# Patient Record
Sex: Female | Born: 1995 | Race: Black or African American | Hispanic: No | Marital: Married | Smoking: Never smoker
Health system: Southern US, Community
[De-identification: ages and names within clinical notes are randomized; demographics above are authoritative.]

## PROBLEM LIST (undated history)

## (undated) DIAGNOSIS — G43909 Migraine, unspecified, not intractable, without status migrainosus: Secondary | ICD-10-CM

## (undated) DIAGNOSIS — J309 Allergic rhinitis, unspecified: Secondary | ICD-10-CM

## (undated) HISTORY — PX: CYSTECTOMY: SUR359

## (undated) HISTORY — PX: CYST EXCISION: SHX5701

## (undated) HISTORY — DX: Allergic rhinitis, unspecified: J30.9

## (undated) HISTORY — DX: Migraine, unspecified, not intractable, without status migrainosus: G43.909

---

## 2018-10-11 HISTORY — PX: OVARIAN CYST REMOVAL: SHX89

## 2020-01-18 ENCOUNTER — Emergency Department (HOSPITAL_COMMUNITY)
Admission: EM | Admit: 2020-01-18 | Discharge: 2020-01-18 | Disposition: A | Discharge status: Home / Self Care | Payer: Self-pay | Attending: Emergency Medicine | Admitting: Emergency Medicine

## 2020-01-18 ENCOUNTER — Encounter (HOSPITAL_COMMUNITY): Payer: Self-pay | Admitting: *Deleted

## 2020-01-18 ENCOUNTER — Other Ambulatory Visit: Payer: Self-pay

## 2020-01-18 DIAGNOSIS — J302 Other seasonal allergic rhinitis: Secondary | ICD-10-CM | POA: Insufficient documentation

## 2020-01-18 MED ORDER — CETIRIZINE HCL 10 MG PO TABS: 10.0000 mg | ORAL_TABLET | Freq: Every day | ORAL | 0 refills | Status: DC

## 2020-01-18 MED ORDER — PREDNISONE 10 MG PO TABS: 10.0000 mg | ORAL_TABLET | Freq: Every day | ORAL | 0 refills | Status: AC

## 2020-01-18 MED ORDER — FLUTICASONE PROPIONATE 50 MCG/ACT NA SUSP: 2.0000 | Freq: Every day | NASAL | 0 refills | Status: DC

## 2020-01-18 NOTE — ED Triage Notes (Signed)
Pt states she started having burning in left nare and left head yesterday, has been taking allergy meds without relief

## 2020-01-18 NOTE — Discharge Instructions (Signed)
You should trial switching the Claritin to Zyrtec and using Flonase for 1 week.  If your symptoms have not improved significantly then you may take the course of prednisone.  Please follow up with your primary care provider within 5-7 days for re-evaluation of your symptoms. If you do not have a primary care provider, information for a healthcare clinic has been provided for you to make arrangements for follow up care. Please return to the emergency department for any new or worsening symptoms.

## 2020-01-18 NOTE — ED Provider Notes (Signed)
[facility] DEPT Provider Note   CSN: [id number] Arrival date & time: 01/18/20  1112     History Chief Complaint  Patient presents with  . Nasal Congestion    Valerie Meyers is a 24 y.o. female.  HPI   24 year old female presenting for evaluation of nasal congestion, sinus pressure and burning to the left side of the nose started a few days ago.  States this is consistent with her prior history of allergies.  She also reports dry eyes and a scratchy throat.  Denies any rhinorrhea, sore throat, fevers, cough or other associated symptoms.  She is been taking Allegra and Claritin without significant relief of symptoms.  States that she usually requires steroids when she has allergies at the beginning of the year.  History reviewed. No pertinent past medical history.  There are no problems to display for this patient.   Past Surgical History:  Procedure Laterality Date  . CYST EXCISION       OB History   No obstetric history on file.     No family history on file.  Social History   Tobacco Use  . Smoking status: Never Smoker  . Smokeless tobacco: Never Used  Substance Use Topics  . Alcohol use: Yes  . Drug use: Yes    Types: Marijuana    Home Medications Prior to Admission medications   Medication Sig Start Date End Date Taking? Authorizing Provider  cetirizine (ZYRTEC) 10 MG tablet Take 1 tablet (10 mg total) by mouth daily. 01/18/20   Kashina Mecum S, PA-C  fluticasone (FLONASE) 50 MCG/ACT nasal spray Place 2 sprays into both nostrils daily. 01/18/20   Newel Oien S, PA-C  predniSONE (DELTASONE) 10 MG tablet Take 1 tablet (10 mg total) by mouth daily for 5 days. 01/18/20 01/23/20  Chele Cornell S, PA-C    Allergies    Patient has no known allergies.  Review of Systems   Review of Systems  Constitutional: Negative for fever.  HENT: Positive for congestion and sinus pressure. Negative for rhinorrhea and sore throat.   Respiratory:  Negative for cough and shortness of breath.     Physical Exam Updated Vital Signs BP 130/88 (BP Location: Right Arm)   Pulse 85   Temp 98.6 F (37 C) (Oral)   Resp 16   Ht 6\' 1"  (1.854 m)   Wt 109.3 kg   SpO2 100%   BMI 31.80 kg/m   Physical Exam Vitals and nursing note reviewed.  Constitutional:      General: She is not in acute distress.    Appearance: She is well-developed.  HENT:     Head: Normocephalic and atraumatic.     Right Ear: Tympanic membrane normal.     Left Ear: Tympanic membrane normal.     Nose:     Comments: Bilateral nasal turbinates are boggy and enlarged.    Mouth/Throat:     Mouth: Mucous membranes are moist.     Pharynx: No oropharyngeal exudate or posterior oropharyngeal erythema.  Eyes:     Conjunctiva/sclera: Conjunctivae normal.  Cardiovascular:     Rate and Rhythm: Normal rate and regular rhythm.     Heart sounds: No murmur.  Pulmonary:     Effort: Pulmonary effort is normal. No respiratory distress.     Breath sounds: Normal breath sounds.  Abdominal:     Palpations: Abdomen is soft.     Tenderness: There is no abdominal tenderness.  Musculoskeletal:     Cervical back:  Neck supple.  Skin:    General: Skin is warm and dry.  Neurological:     Mental Status: She is alert.     ED Results / Procedures / Treatments   Labs (all labs ordered are listed, but only abnormal results are displayed) Labs Reviewed - No data to display  EKG None  Radiology No results found.  Procedures Procedures (including critical care time)  Medications Ordered in ED Medications - No data to display  ED Course  I have reviewed the triage vital signs and the nursing notes.  Pertinent labs & imaging results that were available during my care of the patient were reviewed by me and considered in my medical decision making (see chart for details).    MDM Rules/Calculators/A&P                       Patient presenting with symptoms consistent with  seasonal allergies.  Vital signs are stable and patient is afebrile.  Nontoxic and nonseptic appearing.  Physical exam was within normal limits.  HEENT exam is grossly normal without evidence of bacterial infection.  Will give symptomatic treatment with allergy medications.  Advised on the risks of repeated steroid use and advised to try Zyrtec and Flonase for 1 week prior to filling Rx for steroids.  Advise follow-up with PCP in 1 week and discussed reasons to return immediately to the ED.  All questions answered and patient understands the plan and reasons to return.   Final Clinical Impression(s) / ED Diagnoses Final diagnoses:  Seasonal allergies    Rx / DC Orders ED Discharge Orders         Ordered    fluticasone (FLONASE) 50 MCG/ACT nasal spray  Daily     01/18/20 1215    cetirizine (ZYRTEC) 10 MG tablet  Daily     01/18/20 1215    predniSONE (DELTASONE) 10 MG tablet  Daily     01/18/20 [address], Dondi Burandt S, PA-C 01/18/20 1215    Rolan Bucco, MD 01/18/20 [phone]

## 2020-03-24 ENCOUNTER — Other Ambulatory Visit: Payer: Self-pay

## 2020-03-24 ENCOUNTER — Encounter (HOSPITAL_COMMUNITY): Payer: Self-pay

## 2020-03-24 ENCOUNTER — Emergency Department (HOSPITAL_COMMUNITY)
Admission: EM | Admit: 2020-03-24 | Discharge: 2020-03-24 | Disposition: A | Discharge status: Home / Self Care | Payer: Self-pay | Attending: Emergency Medicine | Admitting: Emergency Medicine

## 2020-03-24 DIAGNOSIS — G44209 Tension-type headache, unspecified, not intractable: Secondary | ICD-10-CM | POA: Insufficient documentation

## 2020-03-24 MED ORDER — DIPHENHYDRAMINE HCL 50 MG/ML IJ SOLN
25.0000 mg | Freq: Once | INTRAMUSCULAR | Status: AC
  Administered 2020-03-24: 25 mg via INTRAVENOUS
  Filled 2020-03-24: qty 1

## 2020-03-24 MED ORDER — KETOROLAC TROMETHAMINE 30 MG/ML IJ SOLN
30.0000 mg | Freq: Once | INTRAMUSCULAR | Status: AC
  Administered 2020-03-24: 30 mg via INTRAVENOUS
  Filled 2020-03-24: qty 1

## 2020-03-24 MED ORDER — SODIUM CHLORIDE 0.9 % IV BOLUS
1000.0000 mL | Freq: Once | INTRAVENOUS | Status: AC
  Administered 2020-03-24: 1000 mL via INTRAVENOUS

## 2020-03-24 MED ORDER — METOCLOPRAMIDE HCL 5 MG/ML IJ SOLN
10.0000 mg | Freq: Once | INTRAMUSCULAR | Status: AC
  Administered 2020-03-24: 10 mg via INTRAVENOUS
  Filled 2020-03-24: qty 2

## 2020-03-24 NOTE — ED Provider Notes (Signed)
[facility] DEPT Provider Note   CSN: [id number] Arrival date & time: 03/24/20  0263     History Chief Complaint  Patient presents with   Headache    Valerie Meyers is a 24 y.o. female with no significant past medical history presenting to the ED with a chief complaint of headache. States that for the past 3 days she has had a constant left-sided headache with associated nausea.  She has a history of headaches that usually located throughout the entire front part of his head with the symptoms localized to the left side.  She denies any vomiting, does endorse intermittent blurry vision on the left side.  She has been using NSAIDs with only minimal improvement in her headache.  Denies any head injuries or falls, numbness in arms or legs, neck stiffness or fever, personal family history of aneurysms, shortness of breath or possibility of pregnancy. HPI     History reviewed. No pertinent past medical history.  There are no problems to display for this patient.   Past Surgical History:  Procedure Laterality Date   CYST EXCISION     CYSTECTOMY       OB History   No obstetric history on file.     Family History  Family history unknown: Yes    Social History   Tobacco Use   Smoking status: Never Smoker   Smokeless tobacco: Never Used  Scientific laboratory technician Use: Never used  Substance Use Topics   Alcohol use: Yes   Drug use: Yes    Types: Marijuana    Home Medications Prior to Admission medications   Medication Sig Start Date End Date Taking? Authorizing Provider  cetirizine (ZYRTEC) 10 MG tablet Take 1 tablet (10 mg total) by mouth daily. 01/18/20   Couture, Cortni S, PA-C  fluticasone (FLONASE) 50 MCG/ACT nasal spray Place 2 sprays into both nostrils daily. 01/18/20   Couture, Cortni S, PA-C    Allergies    Patient has no known allergies.  Review of Systems   Review of Systems  Constitutional: Negative for appetite change, chills  and fever.  HENT: Negative for ear pain, rhinorrhea, sneezing and sore throat.   Eyes: Positive for visual disturbance. Negative for photophobia.  Respiratory: Negative for cough, chest tightness, shortness of breath and wheezing.   Cardiovascular: Negative for chest pain and palpitations.  Gastrointestinal: Positive for nausea. Negative for abdominal pain, blood in stool, constipation, diarrhea and vomiting.  Genitourinary: Negative for dysuria, hematuria and urgency.  Musculoskeletal: Negative for myalgias.  Skin: Negative for rash.  Neurological: Positive for headaches. Negative for dizziness, weakness and light-headedness.    Physical Exam Updated Vital Signs BP 121/70 (BP Location: Left Arm)    Pulse 88    Temp 98 F (36.7 C) (Oral)    Resp 19    Ht 6\' 1"  (1.854 m)    Wt 104.3 kg    LMP 03/12/2020 (Approximate)    SpO2 99%    BMI 30.34 kg/m   Physical Exam Vitals and nursing note reviewed.  Constitutional:      General: She is not in acute distress.    Appearance: She is well-developed.  HENT:     Head: Normocephalic and atraumatic.     Nose: Nose normal.  Eyes:     General: No scleral icterus.       Right eye: No discharge.        Left eye: No discharge.     Conjunctiva/sclera: Conjunctivae  normal.     Pupils: Pupils are equal, round, and reactive to light.  Cardiovascular:     Rate and Rhythm: Normal rate and regular rhythm.     Heart sounds: Normal heart sounds. No murmur heard.  No friction rub. No gallop.   Pulmonary:     Effort: Pulmonary effort is normal. No respiratory distress.     Breath sounds: Normal breath sounds.  Abdominal:     General: Bowel sounds are normal. There is no distension.     Palpations: Abdomen is soft.     Tenderness: There is no abdominal tenderness. There is no guarding.  Musculoskeletal:        General: Normal range of motion.     Cervical back: Normal range of motion and neck supple.  Skin:    General: Skin is warm and dry.      Findings: No rash.  Neurological:     General: No focal deficit present.     Mental Status: She is alert and oriented to person, place, and time.     Cranial Nerves: No cranial nerve deficit.     Sensory: No sensory deficit.     Motor: No weakness or abnormal muscle tone.     Coordination: Coordination normal.     Comments: Pupils reactive. No facial asymmetry noted. Cranial nerves appear grossly intact. Sensation intact to light touch on face, BUE and BLE. Strength 5/5 in BUE and BLE. Normal finger to nose coordination bilaterally.     ED Results / Procedures / Treatments   Labs (all labs ordered are listed, but only abnormal results are displayed) Labs Reviewed - No data to display  EKG None  Radiology No results found.  Procedures Procedures (including critical care time)  Medications Ordered in ED Medications  sodium chloride 0.9 % bolus 1,000 mL (0 mLs Intravenous Stopped 03/24/20 1122)  metoCLOPramide (REGLAN) injection 10 mg (10 mg Intravenous Given 03/24/20 0946)  diphenhydrAMINE (BENADRYL) injection 25 mg (25 mg Intravenous Given 03/24/20 0946)  ketorolac (TORADOL) 30 MG/ML injection 30 mg (30 mg Intravenous Given 03/24/20 1125)    ED Course  I have reviewed the triage vital signs and the nursing notes.  Pertinent labs & imaging results that were available during my care of the patient were reviewed by me and considered in my medical decision making (see chart for details).    MDM Rules/Calculators/A&P                          .  24 year old female presenting to the ED with a chief complaint of headache.  Reports 3-day history of left-sided headache with associated blurry vision and nausea.  No improvement noted with home medications.  She does have history of headaches which usually are located throughout her head.  No neurological deficits noted on exam.  No facial asymmetry, numbness or weakness noted.  She has a normal gait, normal coordination.  No aphasia noted.   She denies personal or family history of aneurysms.  Patient given migraine cocktail with complete resolution of symptoms.  Suspect that her symptoms are due to a tension headache vs complicated migraine. There are no headache characteristics that are lateralizing or concerning for increased ICP, infectious or vascular cause of her symptoms.  Discussed potential imaging but patient declines and states that "I just want this headache to go away."  Patient will be discharged home with strict return precautions and continued medications.  Patient is hemodynamically stable,  in NAD, and able to ambulate in the ED. Evaluation does not show pathology that would require ongoing emergent intervention or inpatient treatment. I explained the diagnosis to the patient. Pain has been managed and has no complaints prior to discharge. Patient is comfortable with above plan and is stable for discharge at this time. All questions were answered prior to disposition. Strict return precautions for returning to the ED were discussed. Encouraged follow up with PCP.   An After Visit Summary was printed and given to the patient.   Portions of this note were generated with Scientist, clinical (histocompatibility and immunogenetics). Dictation errors may occur despite best attempts at proofreading.  Final Clinical Impression(s) / ED Diagnoses Final diagnoses:  Acute non intractable tension-type headache    Rx / DC Orders ED Discharge Orders    None       Dietrich Pates, PA-C 03/24/20 1210    Tegeler, Canary Brim, MD 03/24/20 1510

## 2020-03-24 NOTE — Discharge Instructions (Addendum)
Continue your home medications. Follow-up with your primary care provider or the 1 listed below. Return to the ER for worsening headache, blurry vision, numbness in arms or legs, trouble walking.

## 2020-03-24 NOTE — ED Triage Notes (Signed)
Patient c/o headache x 3 days.Patient also c/o blurred vision, and nausea.

## 2020-03-25 ENCOUNTER — Encounter (HOSPITAL_COMMUNITY): Payer: Self-pay

## 2020-03-25 ENCOUNTER — Emergency Department (HOSPITAL_COMMUNITY): Payer: Self-pay

## 2020-03-25 ENCOUNTER — Emergency Department (HOSPITAL_COMMUNITY)
Admission: EM | Admit: 2020-03-25 | Discharge: 2020-03-25 | Disposition: A | Discharge status: Home / Self Care | Payer: Self-pay | Attending: Emergency Medicine | Admitting: Emergency Medicine

## 2020-03-25 DIAGNOSIS — F1290 Cannabis use, unspecified, uncomplicated: Secondary | ICD-10-CM | POA: Insufficient documentation

## 2020-03-25 DIAGNOSIS — Z888 Allergy status to other drugs, medicaments and biological substances status: Secondary | ICD-10-CM | POA: Insufficient documentation

## 2020-03-25 DIAGNOSIS — R519 Headache, unspecified: Secondary | ICD-10-CM | POA: Insufficient documentation

## 2020-03-25 DIAGNOSIS — H53149 Visual discomfort, unspecified: Secondary | ICD-10-CM | POA: Insufficient documentation

## 2020-03-25 LAB — CBC WITH DIFFERENTIAL/PLATELET
Abs Immature Granulocytes: 0.03 10*3/uL (ref 0.00–0.07)
Basophils Absolute: 0 10*3/uL (ref 0.0–0.1)
Basophils Relative: 0 %
Eosinophils Absolute: 0 10*3/uL (ref 0.0–0.5)
Eosinophils Relative: 0 %
HCT: 39.6 % (ref 36.0–46.0)
Hemoglobin: 12 g/dL (ref 12.0–15.0)
Immature Granulocytes: 0 %
Lymphocytes Relative: 20 %
Lymphs Abs: 2.2 10*3/uL (ref 0.7–4.0)
MCH: 25.5 pg — ABNORMAL LOW (ref 26.0–34.0)
MCHC: 30.3 g/dL (ref 30.0–36.0)
MCV: 84.1 fL (ref 80.0–100.0)
Monocytes Absolute: 0.6 10*3/uL (ref 0.1–1.0)
Monocytes Relative: 5 %
Neutro Abs: 8.2 10*3/uL — ABNORMAL HIGH (ref 1.7–7.7)
Neutrophils Relative %: 75 %
Platelets: 329 10*3/uL (ref 150–400)
RBC: 4.71 MIL/uL (ref 3.87–5.11)
RDW: 15.9 % — ABNORMAL HIGH (ref 11.5–15.5)
WBC: 11 10*3/uL — ABNORMAL HIGH (ref 4.0–10.5)
nRBC: 0 % (ref 0.0–0.2)

## 2020-03-25 LAB — BASIC METABOLIC PANEL
Anion gap: 9 (ref 5–15)
BUN: 6 mg/dL (ref 6–20)
CO2: 24 mmol/L (ref 22–32)
Calcium: 9 mg/dL (ref 8.9–10.3)
Chloride: 105 mmol/L (ref 98–111)
Creatinine, Ser: 0.81 mg/dL (ref 0.44–1.00)
GFR calc Af Amer: >60 mL/min (ref >60)
GFR calc non Af Amer: >60 mL/min (ref >60)
Glucose, Bld: 101 mg/dL — ABNORMAL HIGH (ref 70–99)
Potassium: 4.4 mmol/L (ref 3.5–5.1)
Sodium: 138 mmol/L (ref 135–145)

## 2020-03-25 LAB — I-STAT BETA HCG BLOOD, ED (MC, WL, AP ONLY): I-stat hCG, quantitative: <5 m[IU]/mL (ref <5)

## 2020-03-25 MED ORDER — SODIUM CHLORIDE (PF) 0.9 % IJ SOLN
INTRAMUSCULAR | Status: AC
  Filled 2020-03-25: qty 50

## 2020-03-25 MED ORDER — DROPERIDOL 2.5 MG/ML IJ SOLN: 2.5000 mg | Freq: Once | INTRAMUSCULAR | Status: DC

## 2020-03-25 MED ORDER — IOHEXOL 350 MG/ML SOLN
100.0000 mL | Freq: Once | INTRAVENOUS | Status: AC | PRN
  Administered 2020-03-25: 100 mL via INTRAVENOUS

## 2020-03-25 MED ORDER — SODIUM CHLORIDE 0.9 % IV BOLUS
1000.0000 mL | Freq: Once | INTRAVENOUS | Status: AC
  Administered 2020-03-25: 1000 mL via INTRAVENOUS

## 2020-03-25 MED ORDER — PROCHLORPERAZINE EDISYLATE 10 MG/2ML IJ SOLN
5.0000 mg | Freq: Once | INTRAMUSCULAR | Status: AC
  Administered 2020-03-25: 5 mg via INTRAVENOUS
  Filled 2020-03-25: qty 2

## 2020-03-25 MED ORDER — SODIUM CHLORIDE 0.9 % IV SOLN: INTRAVENOUS | Status: DC

## 2020-03-25 MED ORDER — DEXAMETHASONE SODIUM PHOSPHATE 10 MG/ML IJ SOLN
10.0000 mg | Freq: Once | INTRAMUSCULAR | Status: AC
  Administered 2020-03-25: 10 mg via INTRAVENOUS
  Filled 2020-03-25: qty 1

## 2020-03-25 MED ORDER — DROPERIDOL 2.5 MG/ML IJ SOLN
1.2500 mg | Freq: Once | INTRAMUSCULAR | Status: AC
  Administered 2020-03-25: 1.25 mg via INTRAVENOUS
  Filled 2020-03-25: qty 2

## 2020-03-25 MED ORDER — VALPROATE SODIUM 500 MG/5ML IV SOLN
500.0000 mg | Freq: Once | INTRAVENOUS | Status: AC
  Administered 2020-03-25: 500 mg via INTRAVENOUS
  Filled 2020-03-25: qty 5

## 2020-03-25 MED ORDER — DIPHENHYDRAMINE HCL 50 MG/ML IJ SOLN
12.5000 mg | Freq: Once | INTRAMUSCULAR | Status: AC
  Administered 2020-03-25: 12.5 mg via INTRAVENOUS
  Filled 2020-03-25: qty 1

## 2020-03-25 NOTE — ED Triage Notes (Signed)
Patient arrived stating she has a headache that did not resolve after being seen yesterday. Reports photosensitivity but denies NV. Last dose of Ibuprofen at 7pm last night.

## 2020-03-25 NOTE — ED Provider Notes (Signed)
[facility] DEPT Provider Note   CSN: [id number] Arrival date & time: 03/25/20  0530     History Chief Complaint  Patient presents with  . Headache    Valerie Meyers is a 24 y.o. female.  24 year old female presents with headache x24 to 48 hours.  Seen yesterday in the ED for similar complaints.  Work at that time included pain medications as she did feel better.  She at that time did not have any fever or chills.  No focal neurological deficits.  Mild photophobia at that time.  Denies any neck pain or new rashes.  States she went home and then took a nap and when she woke up the headache returned characterized as frontal throbbing.  Photophobia continued with flashing lights.  Has nausea but no vomiting.  Still denies neck pain.  Took ibuprofen without relief        History reviewed. No pertinent past medical history.  There are no problems to display for this patient.   Past Surgical History:  Procedure Laterality Date  . CYST EXCISION    . CYSTECTOMY       OB History   No obstetric history on file.     Family History  Family history unknown: Yes    Social History   Tobacco Use  . Smoking status: Never Smoker  . Smokeless tobacco: Never Used  Vaping Use  . Vaping Use: Never used  Substance Use Topics  . Alcohol use: Yes  . Drug use: Yes    Types: Marijuana    Home Medications Prior to Admission medications   Medication Sig Start Date End Date Taking? Authorizing Provider  ibuprofen (ADVIL) 200 MG tablet Take 200 mg by mouth every 6 (six) hours as needed for headache or moderate pain.   Yes [provider]  cetirizine (ZYRTEC) 10 MG tablet Take 1 tablet (10 mg total) by mouth daily. Patient not taking: Reported on 03/25/2020 01/18/20   Couture, Cortni S, PA-C  fluticasone (FLONASE) 50 MCG/ACT nasal spray Place 2 sprays into both nostrils daily. Patient not taking: Reported on 03/25/2020 01/18/20   Couture, Cortni S, PA-C     Allergies    Other  Review of Systems   Review of Systems  All other systems reviewed and are negative.   Physical Exam Updated Vital Signs BP 130/84   Pulse 74   Temp 98 F (36.7 C) (Oral)   Resp 16   LMP 03/12/2020 (Approximate)   SpO2 100%   Physical Exam Vitals and nursing note reviewed.  Constitutional:      General: She is not in acute distress.    Appearance: Normal appearance. She is well-developed. She is not toxic-appearing.  HENT:     Head: Normocephalic and atraumatic.  Eyes:     General: Lids are normal.     Conjunctiva/sclera: Conjunctivae normal.     Pupils: Pupils are equal, round, and reactive to light.  Neck:     Thyroid: No thyroid mass.     Trachea: No tracheal deviation.  Cardiovascular:     Rate and Rhythm: Normal rate and regular rhythm.     Heart sounds: Normal heart sounds. No murmur heard.  No gallop.   Pulmonary:     Effort: Pulmonary effort is normal. No respiratory distress.     Breath sounds: Normal breath sounds. No stridor. No decreased breath sounds, wheezing, rhonchi or rales.  Abdominal:     General: Bowel sounds are normal. There is no  distension.     Palpations: Abdomen is soft.     Tenderness: There is no abdominal tenderness. There is no rebound.  Musculoskeletal:        General: No tenderness. Normal range of motion.     Cervical back: Normal range of motion and neck supple.  Skin:    General: Skin is warm and dry.     Findings: No abrasion or rash.  Neurological:     General: No focal deficit present.     Mental Status: She is alert and oriented to person, place, and time.     GCS: GCS eye subscore is 4. GCS verbal subscore is 5. GCS motor subscore is 6.     Cranial Nerves: No cranial nerve deficit.     Sensory: No sensory deficit.  Psychiatric:        Speech: Speech normal.        Behavior: Behavior normal.     ED Results / Procedures / Treatments   Labs (all labs ordered are listed, but only abnormal  results are displayed) Labs Reviewed  CBC WITH DIFFERENTIAL/PLATELET  BASIC METABOLIC PANEL  I-STAT BETA HCG BLOOD, ED ([facility], [facility], [facility] ONLY)    EKG None  Radiology No results found.  Procedures Procedures (including critical care time)  Medications Ordered in ED Medications  sodium chloride 0.9 % bolus 1,000 mL (has no administration in time range)  0.9 %  sodium chloride infusion (has no administration in time range)  prochlorperazine (COMPAZINE) injection 5 mg (has no administration in time range)  diphenhydrAMINE (BENADRYL) injection 12.5 mg (has no administration in time range)    ED Course  I have reviewed the triage vital signs and the nursing notes.  Pertinent labs & imaging results that were available during my care of the patient were reviewed by me and considered in my medical decision making (see chart for details).    MDM Rules/Calculators/A&P                         Patient has CT angio which is negative for hemorrhage or aneurysm. Patient treated multiple medications for her migraine and does feel better.  Patient stable for discharge Final Clinical Impression(s) / ED Diagnoses Final diagnoses:  None    Rx / DC Orders ED Discharge Orders    None       Lorre Nick, MD 03/25/20 1305

## 2020-03-25 NOTE — ED Notes (Signed)
An After Visit Summary was printed and given to the patient. Discharge instructions given and no further questions at this time.  

## 2020-06-12 ENCOUNTER — Other Ambulatory Visit: Payer: Self-pay

## 2020-06-12 ENCOUNTER — Encounter (HOSPITAL_COMMUNITY): Payer: Self-pay

## 2020-06-12 ENCOUNTER — Emergency Department (HOSPITAL_COMMUNITY)
Admission: EM | Admit: 2020-06-12 | Discharge: 2020-06-12 | Disposition: A | Discharge status: Home / Self Care | Payer: HRSA Program | Attending: Emergency Medicine | Admitting: Emergency Medicine

## 2020-06-12 DIAGNOSIS — U071 COVID-19: Secondary | ICD-10-CM | POA: Insufficient documentation

## 2020-06-12 DIAGNOSIS — R509 Fever, unspecified: Secondary | ICD-10-CM | POA: Diagnosis present

## 2020-06-12 DIAGNOSIS — Z20822 Contact with and (suspected) exposure to covid-19: Secondary | ICD-10-CM

## 2020-06-12 LAB — SARS CORONAVIRUS 2 BY RT PCR (HOSPITAL ORDER, PERFORMED IN ~~LOC~~ HOSPITAL LAB): SARS Coronavirus 2: POSITIVE — AB

## 2020-06-12 MED ORDER — ACETAMINOPHEN 500 MG PO TABS
1000.0000 mg | ORAL_TABLET | Freq: Once | ORAL | Status: AC
  Administered 2020-06-12: 1000 mg via ORAL
  Filled 2020-06-12: qty 2

## 2020-06-12 MED ORDER — ALBUTEROL SULFATE HFA 108 (90 BASE) MCG/ACT IN AERS: 1.0000 | INHALATION_SPRAY | Freq: Four times a day (QID) | RESPIRATORY_TRACT | 0 refills | Status: DC | PRN

## 2020-06-12 MED ORDER — ONDANSETRON 4 MG PO TBDP: 4.0000 mg | ORAL_TABLET | Freq: Three times a day (TID) | ORAL | 0 refills | Status: DC | PRN

## 2020-06-12 NOTE — ED Provider Notes (Signed)
Emergency Department Provider Note   I have reviewed the triage vital signs and the nursing notes.   HISTORY  Chief Complaint Fever   HPI Valerie Meyers is a 24 y.o. female presents to the ED with fever, chills, nausea, vomiting, and HA starting in the last 7 days. She has COVID positive children at home but has not tested positive yet herself. She denies any CP or SOB. No radiation of symptoms or modifying factors.     History reviewed. No pertinent past medical history.  There are no problems to display for this patient.   Past Surgical History:  Procedure Laterality Date   CYST EXCISION     CYSTECTOMY      Allergies Other  Family History  Family history unknown: Yes    Social History Social History   Tobacco Use   Smoking status: Never Smoker   Smokeless tobacco: Never Used  Building services engineer Use: Never used  Substance Use Topics   Alcohol use: Yes   Drug use: Yes    Types: Marijuana    Review of Systems  Constitutional: Positive fever/chills Eyes: No visual changes. ENT: No sore throat. Cardiovascular: Denies chest pain. Respiratory: Denies shortness of breath. Positive cough.  Gastrointestinal: No abdominal pain. Positive nausea and vomiting.  No diarrhea.  No constipation. Genitourinary: Negative for dysuria. Musculoskeletal: Positive body aches diffusely.  Skin: Negative for rash. Neurological: Negative for focal weakness or numbness. Positive HA.   10-point ROS otherwise negative.  ____________________________________________   PHYSICAL EXAM:  VITAL SIGNS: ED Triage Vitals  Enc Vitals Group     BP 06/12/20 1127 121/72     Pulse Rate 06/12/20 1127 76     Resp 06/12/20 1127 18     Temp 06/12/20 1127 (!) 101.9 F (38.8 C)     Temp Source 06/12/20 1127 Oral     SpO2 06/12/20 1127 100 %   Constitutional: Alert and oriented. Well appearing and in no acute distress. Eyes: Conjunctivae are normal. Head: Atraumatic. Nose: No  congestion/rhinnorhea. Mouth/Throat: Mucous membranes are moist.  Neck: No stridor.   Cardiovascular: Normal rate, regular rhythm. Good peripheral circulation. Grossly normal heart sounds.   Respiratory: Normal respiratory effort.  No retractions. Lungs CTAB. Gastrointestinal: No distention.  Musculoskeletal: No gross deformities of extremities. Neurologic:  Normal speech and language.  Skin:  Skin is warm, dry and intact. No rash noted.   ____________________________________________   LABS (all labs ordered are listed, but only abnormal results are displayed)  Labs Reviewed  SARS CORONAVIRUS 2 BY RT PCR (HOSPITAL ORDER, PERFORMED IN [facility]) - Abnormal; Notable for the following components:      Result Value   SARS Coronavirus 2 POSITIVE (*)    All other components within normal limits   ____________________________________________   PROCEDURES  Procedure(s) performed:   Procedures  None ____________________________________________   INITIAL IMPRESSION / ASSESSMENT AND PLAN / ED COURSE  Pertinent labs & imaging results that were available during my care of the patient were reviewed by me and considered in my medical decision making (see chart for details).   Patient presents with COVID symptoms and close positive contacts. Doubt sepsis or developing serious bacterial infection. Most of symptoms are respiratory. Patient tested here with PCR and with quarantine pending results. No respiratory distress or hypoxemia to prompt emergent testing or other treatments here.   Valerie Meyers was evaluated in Emergency Department on 06/16/2020 for the symptoms described in the history of present  illness. She was evaluated in the context of the global COVID-19 pandemic, which necessitated consideration that the patient might be at risk for infection with the SARS-CoV-2 virus that causes COVID-19. Institutional protocols and algorithms that pertain to the evaluation of  patients at risk for COVID-19 are in a state of rapid change based on information released by regulatory bodies including the CDC and federal and state organizations. These policies and algorithms were followed during the patient's care in the ED.  ____________________________________________  FINAL CLINICAL IMPRESSION(S) / ED DIAGNOSES  Final diagnoses:  Suspected COVID-19 virus infection     MEDICATIONS GIVEN DURING THIS VISIT:  Medications  acetaminophen (TYLENOL) tablet 1,000 mg (1,000 mg Oral Given 06/12/20 1203)     NEW OUTPATIENT MEDICATIONS STARTED DURING THIS VISIT:  Discharge Medication List as of 06/12/2020 12:02 PM    START taking these medications   Details  albuterol (VENTOLIN HFA) 108 (90 Base) MCG/ACT inhaler Inhale 1-2 puffs into the lungs every 6 (six) hours as needed for wheezing or shortness of breath., Starting Thu 06/12/2020, Normal    ondansetron (ZOFRAN ODT) 4 MG disintegrating tablet Take 1 tablet (4 mg total) by mouth every 8 (eight) hours as needed for nausea or vomiting., Starting Thu 06/12/2020, Normal        Note:  This document was prepared using Dragon voice recognition software and may include unintentional dictation errors.  Alona Bene, MD, [facility]    Erez Mccallum, Arlyss Repress, MD 06/16/20 [phone]

## 2020-06-12 NOTE — ED Triage Notes (Addendum)
Pt arrived via walk in, c/o fevers, chills, n/v, headache and back pain since last night. States children at home COVID (+). Has not taken tylenol since last night. Able to tolerate POs at home.

## 2020-06-12 NOTE — Discharge Instructions (Signed)
Person Under Monitoring Name: Valerie Meyers  Location: [address] [place] [address] [place] [address]   Infection Prevention Recommendations for Individuals Confirmed to have, or Being Evaluated for, 2019 Novel Coronavirus (COVID-19) Infection Who Receive Care at Home  Individuals who are confirmed to have, or are being evaluated for, COVID-19 should follow the prevention steps below until a healthcare provider or local or state health department says they can return to normal activities.  Stay home except to get medical care You should restrict activities outside your home, except for getting medical care. Do not go to work, school, or public areas, and do not use public transportation or taxis.  Call ahead before visiting your doctor Before your medical appointment, call the healthcare provider and tell them that you have, or are being evaluated for, COVID-19 infection. This will help the healthcare provider's office take steps to keep other people from getting infected. Ask your healthcare provider to call the local or state health department.  Monitor your symptoms Seek prompt medical attention if your illness is worsening (e.g., difficulty breathing). Before going to your medical appointment, call the healthcare provider and tell them that you have, or are being evaluated for, COVID-19 infection. Ask your healthcare provider to call the local or state health department.  Wear a facemask You should wear a facemask that covers your nose and mouth when you are in the same room with other people and when you visit a healthcare provider. People who live with or visit you should also wear a facemask while they are in the same room with you.  Separate yourself from other people in your home As much as possible, you should stay in a different room from other people in your home. Also, you should use a separate bathroom, if available.  Avoid sharing household items You should not  share dishes, drinking glasses, cups, eating utensils, towels, bedding, or other items with other people in your home. After using these items, you should wash them thoroughly with soap and water.  Cover your coughs and sneezes Cover your mouth and nose with a tissue when you cough or sneeze, or you can cough or sneeze into your sleeve. Throw used tissues in a lined trash can, and immediately wash your hands with soap and water for at least 20 seconds or use an alcohol-based hand rub.  Wash your [employer] your hands often and thoroughly with soap and water for at least 20 seconds. You can use an alcohol-based hand sanitizer if soap and water are not available and if your hands are not visibly dirty. Avoid touching your eyes, nose, and mouth with unwashed hands.   Prevention Steps for Caregivers and Household Members of Individuals Confirmed to have, or Being Evaluated for, COVID-19 Infection Being Cared for in the Home  If you live with, or provide care at home for, a person confirmed to have, or being evaluated for, COVID-19 infection please follow these guidelines to prevent infection:  Follow healthcare provider's instructions Make sure that you understand and can help the patient follow any healthcare provider instructions for all care.  Provide for the patient's basic needs You should help the patient with basic needs in the home and provide support for getting groceries, prescriptions, and other personal needs.  Monitor the patient's symptoms If they are getting sicker, call his or her medical provider and tell them that the patient has, or is being evaluated for, COVID-19 infection. This will help the healthcare provider's  office take steps to keep other people from getting infected. Ask the healthcare provider to call the local or state health department.  Limit the number of people who have contact with the patient If possible, have only one caregiver for the  patient. Other household members should stay in another home or place of residence. If this is not possible, they should stay in another room, or be separated from the patient as much as possible. Use a separate bathroom, if available. Restrict visitors who do not have an essential need to be in the home.  Keep older adults, very young children, and other sick people away from the patient Keep older adults, very young children, and those who have compromised immune systems or chronic health conditions away from the patient. This includes people with chronic heart, lung, or kidney conditions, diabetes, and cancer.  Ensure good ventilation Make sure that shared spaces in the home have good air flow, such as from an air conditioner or an opened window, weather permitting.  Wash your hands often Wash your hands often and thoroughly with soap and water for at least 20 seconds. You can use an alcohol based hand sanitizer if soap and water are not available and if your hands are not visibly dirty. Avoid touching your eyes, nose, and mouth with unwashed hands. Use disposable paper towels to dry your hands. If not available, use dedicated cloth towels and replace them when they become wet.  Wear a facemask and gloves Wear a disposable facemask at all times in the room and gloves when you touch or have contact with the patient's blood, body fluids, and/or secretions or excretions, such as sweat, saliva, sputum, nasal mucus, vomit, urine, or feces.  Ensure the mask fits over your nose and mouth tightly, and do not touch it during use. Throw out disposable facemasks and gloves after using them. Do not reuse. Wash your hands immediately after removing your facemask and gloves. If your personal clothing becomes contaminated, carefully remove clothing and launder. Wash your hands after handling contaminated clothing. Place all used disposable facemasks, gloves, and other waste in a lined container before  disposing them with other household waste. Remove gloves and wash your hands immediately after handling these items.  Do not share dishes, glasses, or other household items with the patient Avoid sharing household items. You should not share dishes, drinking glasses, cups, eating utensils, towels, bedding, or other items with a patient who is confirmed to have, or being evaluated for, COVID-19 infection. After the person uses these items, you should wash them thoroughly with soap and water.  Wash laundry thoroughly Immediately remove and wash clothes or bedding that have blood, body fluids, and/or secretions or excretions, such as sweat, saliva, sputum, nasal mucus, vomit, urine, or feces, on them. Wear gloves when handling laundry from the patient. Read and follow directions on labels of laundry or clothing items and detergent. In general, wash and dry with the warmest temperatures recommended on the label.  Clean all areas the individual has used often Clean all touchable surfaces, such as counters, tabletops, doorknobs, bathroom fixtures, toilets, phones, keyboards, tablets, and bedside tables, every day. Also, clean any surfaces that may have blood, body fluids, and/or secretions or excretions on them. Wear gloves when cleaning surfaces the patient has come in contact with. Use a diluted bleach solution (e.g., dilute bleach with 1 part bleach and 10 parts water) or a household disinfectant with a label that says EPA-registered for coronaviruses. To make a  bleach solution at home, add 1 tablespoon of bleach to 1 quart (4 cups) of water. For a larger supply, add  cup of bleach to 1 gallon (16 cups) of water. Read labels of cleaning products and follow recommendations provided on product labels. Labels contain instructions for safe and effective use of the cleaning product including precautions you should take when applying the product, such as wearing gloves or eye protection and making sure you  have good ventilation during use of the product. Remove gloves and wash hands immediately after cleaning.  Monitor yourself for signs and symptoms of illness Caregivers and household members are considered close contacts, should monitor their health, and will be asked to limit movement outside of the home to the extent possible. Follow the monitoring steps for close contacts listed on the symptom monitoring form.   ? If you have additional questions, contact your local health department or call the epidemiologist on call at [phone] (available 24/7). ? This guidance is subject to change. For the most up-to-date guidance from [facility], please refer to their website: [email or web address]

## 2020-12-19 ENCOUNTER — Emergency Department (HOSPITAL_COMMUNITY)
Admission: EM | Admit: 2020-12-19 | Discharge: 2020-12-19 | Disposition: A | Discharge status: Home / Self Care | Payer: Self-pay | Attending: Emergency Medicine | Admitting: Emergency Medicine

## 2020-12-19 ENCOUNTER — Other Ambulatory Visit: Payer: Self-pay

## 2020-12-19 ENCOUNTER — Encounter (HOSPITAL_COMMUNITY): Payer: Self-pay

## 2020-12-19 DIAGNOSIS — N760 Acute vaginitis: Secondary | ICD-10-CM | POA: Insufficient documentation

## 2020-12-19 DIAGNOSIS — B9689 Other specified bacterial agents as the cause of diseases classified elsewhere: Secondary | ICD-10-CM | POA: Insufficient documentation

## 2020-12-19 DIAGNOSIS — K047 Periapical abscess without sinus: Secondary | ICD-10-CM

## 2020-12-19 DIAGNOSIS — K029 Dental caries, unspecified: Secondary | ICD-10-CM | POA: Insufficient documentation

## 2020-12-19 LAB — URINALYSIS, ROUTINE W REFLEX MICROSCOPIC
Bilirubin Urine: NEGATIVE
Glucose, UA: NEGATIVE mg/dL
Hgb urine dipstick: NEGATIVE
Ketones, ur: NEGATIVE mg/dL
Leukocytes,Ua: NEGATIVE
Nitrite: NEGATIVE
Protein, ur: NEGATIVE mg/dL
Specific Gravity, Urine: 1.02 (ref 1.005–1.030)
pH: 7 (ref 5.0–8.0)

## 2020-12-19 LAB — WET PREP, GENITAL
Sperm: NONE SEEN
Trich, Wet Prep: NONE SEEN
Yeast Wet Prep HPF POC: NONE SEEN

## 2020-12-19 LAB — I-STAT BETA HCG BLOOD, ED (MC, WL, AP ONLY): I-stat hCG, quantitative: <5 m[IU]/mL (ref <5)

## 2020-12-19 LAB — HIV ANTIBODY (ROUTINE TESTING W REFLEX): HIV Screen 4th Generation wRfx: NONREACTIVE

## 2020-12-19 MED ORDER — AMOXICILLIN 500 MG PO CAPS: 500.0000 mg | ORAL_CAPSULE | Freq: Three times a day (TID) | ORAL | 0 refills | Status: DC

## 2020-12-19 MED ORDER — METRONIDAZOLE 500 MG PO TABS: 500.0000 mg | ORAL_TABLET | Freq: Two times a day (BID) | ORAL | 0 refills | Status: DC

## 2020-12-19 MED ORDER — ACETAMINOPHEN 500 MG PO TABS
1000.0000 mg | ORAL_TABLET | Freq: Once | ORAL | Status: AC
  Administered 2020-12-19: 1000 mg via ORAL
  Filled 2020-12-19: qty 2

## 2020-12-19 NOTE — Discharge Instructions (Signed)
Follow-up with a dentist as soon as possible.  Return here as needed if you have any worsening symptoms.

## 2020-12-19 NOTE — ED Triage Notes (Signed)
patient reports that when she woke today she noted that her face was swollen. Patient denies any problems breathing or swallowing.  Patient c/o yellow vaginal discharge x 2 days. Patient states "I used a different Darene Lamer product and then I got the discharge."

## 2020-12-19 NOTE — ED Provider Notes (Signed)
[facility]-EMERGENCY DEPT Provider Note   CSN: [id number] Arrival date & time: 12/19/20  1656     History Chief Complaint  Patient presents with  . Facial Swelling  . Vaginal Discharge    Valerie Meyers is a 25 y.o. female.  Patient is a 25 year old female who presents with vaginal discharge.  She presents with a 2-day history of yellow discharge.  She also says it has a foul odor.  She denies abdominal pain.  No urinary symptoms.  No nausea or vomiting.  No known fevers.  She has had a history of BV in the past.  She says that she switch to a different Darene Lamer product and thinks that may have caused it.  She also woke up this morning with some tooth ache in her left upper tooth and some swelling around the area.  She denies any other facial swelling.  No trouble swallowing.  No sinus congestion or discharge.        History reviewed. No pertinent past medical history.  There are no problems to display for this patient.   Past Surgical History:  Procedure Laterality Date  . CYST EXCISION    . CYSTECTOMY       OB History   No obstetric history on file.     Family History  Family history unknown: Yes    Social History   Tobacco Use  . Smoking status: Never Smoker  . Smokeless tobacco: Never Used  Vaping Use  . Vaping Use: Never used  Substance Use Topics  . Alcohol use: Yes  . Drug use: Yes    Types: Marijuana    Home Medications Prior to Admission medications   Medication Sig Start Date End Date Taking? Authorizing Provider  amoxicillin (AMOXIL) 500 MG capsule Take 1 capsule (500 mg total) by mouth 3 (three) times daily. 12/19/20  Yes Rolan Bucco, MD  metroNIDAZOLE (FLAGYL) 500 MG tablet Take 1 tablet (500 mg total) by mouth 2 (two) times daily. One po bid x 7 days 12/19/20  Yes Rolan Bucco, MD  albuterol (VENTOLIN HFA) 108 (90 Base) MCG/ACT inhaler Inhale 1-2 puffs into the lungs every 6 (six) hours as needed for wheezing or shortness of  breath. 06/12/20   Long, Arlyss Repress, MD  cetirizine (ZYRTEC) 10 MG tablet Take 1 tablet (10 mg total) by mouth daily. Patient not taking: Reported on 03/25/2020 01/18/20   Couture, Cortni S, PA-C  fluticasone (FLONASE) 50 MCG/ACT nasal spray Place 2 sprays into both nostrils daily. Patient not taking: Reported on 03/25/2020 01/18/20   Couture, Cortni S, PA-C  ibuprofen (ADVIL) 200 MG tablet Take 200 mg by mouth every 6 (six) hours as needed for headache or moderate pain.    [provider]  ondansetron (ZOFRAN ODT) 4 MG disintegrating tablet Take 1 tablet (4 mg total) by mouth every 8 (eight) hours as needed for nausea or vomiting. 06/12/20   Long, Arlyss Repress, MD    Allergies    Other  Review of Systems   Review of Systems  Constitutional: Negative for chills, diaphoresis, fatigue and fever.  HENT: Positive for dental problem. Negative for congestion, rhinorrhea and sneezing.   Eyes: Negative.   Respiratory: Negative for cough, chest tightness and shortness of breath.   Cardiovascular: Negative for chest pain and leg swelling.  Gastrointestinal: Negative for abdominal pain, blood in stool, diarrhea, nausea and vomiting.  Genitourinary: Positive for vaginal discharge. Negative for difficulty urinating, flank pain, frequency, hematuria, vaginal bleeding and vaginal  pain.  Musculoskeletal: Negative for arthralgias and back pain.  Skin: Negative for rash.  Neurological: Negative for dizziness, speech difficulty, weakness, numbness and headaches.    Physical Exam Updated Vital Signs BP 110/61 (BP Location: Left Arm)   Pulse 73   Temp 98.4 F (36.9 C) (Oral)   Resp 16   Ht 5' 9.75" (1.772 m)   Wt 97.8 kg   LMP 12/09/2020 (Approximate)   SpO2 99%   BMI 31.16 kg/m   Physical Exam Constitutional:      Appearance: She is well-developed.  HENT:     Head: Normocephalic and atraumatic.     Mouth/Throat:     Comments: Positive tenderness around the left upper first molar.  There is some  mild swelling to the area.  No induration or fluctuance is noted.  No trismus.  Uvula is midline.  No other facial swelling is noted. Eyes:     Pupils: Pupils are equal, round, and reactive to light.  Cardiovascular:     Rate and Rhythm: Normal rate and regular rhythm.     Heart sounds: Normal heart sounds.  Pulmonary:     Effort: Pulmonary effort is normal. No respiratory distress.     Breath sounds: Normal breath sounds. No wheezing or rales.  Chest:     Chest wall: No tenderness.  Abdominal:     General: Bowel sounds are normal.     Palpations: Abdomen is soft.     Tenderness: There is no abdominal tenderness. There is no guarding or rebound.  Genitourinary:    Comments: Positive white discharge, no cervical motion tenderness, no adnexal tenderness Musculoskeletal:        General: Normal range of motion.     Cervical back: Normal range of motion and neck supple.  Lymphadenopathy:     Cervical: No cervical adenopathy.  Skin:    General: Skin is warm and dry.     Findings: No rash.  Neurological:     Mental Status: She is alert and oriented to person, place, and time.     ED Results / Procedures / Treatments   Labs (all labs ordered are listed, but only abnormal results are displayed) Labs Reviewed  WET PREP, GENITAL - Abnormal; Notable for the following components:      Result Value   Clue Cells Wet Prep HPF POC PRESENT (*)    WBC, Wet Prep HPF POC FEW (*)    All other components within normal limits  URINALYSIS, ROUTINE W REFLEX MICROSCOPIC - Abnormal; Notable for the following components:   Color, Urine AMBER (*)    All other components within normal limits  RPR  HIV ANTIBODY (ROUTINE TESTING W REFLEX)  I-STAT BETA HCG BLOOD, ED ([facility], [facility], [facility] ONLY)  GC/CHLAMYDIA PROBE AMP ([place]) NOT AT [facility]    EKG None  Radiology No results found.  Procedures Procedures   Medications Ordered in ED Medications  acetaminophen (TYLENOL) tablet 1,000 mg (1,000 mg Oral  Given 12/19/20 1807)    ED Course  I have reviewed the triage vital signs and the nursing notes.  Pertinent labs & imaging results that were available during my care of the patient were reviewed by me and considered in my medical decision making (see chart for details).    MDM Rules/Calculators/A&P                          Patient is a 25 year old female who presents with vaginal discharge.  She has evidence of BV on exam.  Her STD testing is pending.  She will be started on Flagyl.  She also has some mild swelling around her left upper molar.  I do not see any obvious tooth decay but it appears to be consistent with a dental infection.  We will start her on amoxicillin.  She does not have any airway compromise or suggestions of deeper infections.  She was given resources about following up with a dentist.  Return precautions were given. Final Clinical Impression(s) / ED Diagnoses Final diagnoses:  Dental infection  BV (bacterial vaginosis)    Rx / DC Orders ED Discharge Orders         Ordered    metroNIDAZOLE (FLAGYL) 500 MG tablet  2 times daily        12/19/20 2004    amoxicillin (AMOXIL) 500 MG capsule  3 times daily        12/19/20 2004           Rolan Bucco, MD 12/19/20 2006

## 2020-12-20 LAB — RPR: RPR Ser Ql: NONREACTIVE

## 2020-12-21 LAB — GC/CHLAMYDIA PROBE AMP (~~LOC~~) NOT AT ARMC
Chlamydia: NEGATIVE
Comment: NEGATIVE
Comment: NORMAL
Neisseria Gonorrhea: NEGATIVE

## 2021-05-13 ENCOUNTER — Emergency Department (HOSPITAL_COMMUNITY)
Admission: EM | Admit: 2021-05-13 | Discharge: 2021-05-13 | Disposition: A | Discharge status: Home / Self Care | Payer: Self-pay | Attending: Emergency Medicine | Admitting: Emergency Medicine

## 2021-05-13 ENCOUNTER — Other Ambulatory Visit: Payer: Self-pay

## 2021-05-13 ENCOUNTER — Encounter (HOSPITAL_COMMUNITY): Payer: Self-pay

## 2021-05-13 DIAGNOSIS — R531 Weakness: Secondary | ICD-10-CM | POA: Insufficient documentation

## 2021-05-13 DIAGNOSIS — Z20822 Contact with and (suspected) exposure to covid-19: Secondary | ICD-10-CM | POA: Insufficient documentation

## 2021-05-13 DIAGNOSIS — R202 Paresthesia of skin: Secondary | ICD-10-CM | POA: Insufficient documentation

## 2021-05-13 DIAGNOSIS — R519 Headache, unspecified: Secondary | ICD-10-CM | POA: Insufficient documentation

## 2021-05-13 DIAGNOSIS — H539 Unspecified visual disturbance: Secondary | ICD-10-CM | POA: Insufficient documentation

## 2021-05-13 DIAGNOSIS — R4182 Altered mental status, unspecified: Secondary | ICD-10-CM | POA: Insufficient documentation

## 2021-05-13 DIAGNOSIS — R569 Unspecified convulsions: Secondary | ICD-10-CM | POA: Insufficient documentation

## 2021-05-13 DIAGNOSIS — R55 Syncope and collapse: Secondary | ICD-10-CM | POA: Insufficient documentation

## 2021-05-13 LAB — RESP PANEL BY RT-PCR (FLU A&B, COVID) ARPGX2
Influenza A by PCR: NEGATIVE
Influenza B by PCR: NEGATIVE
SARS Coronavirus 2 by RT PCR: NEGATIVE

## 2021-05-13 MED ORDER — METOCLOPRAMIDE HCL 5 MG/ML IJ SOLN
10.0000 mg | Freq: Once | INTRAMUSCULAR | Status: AC
  Administered 2021-05-13: 10 mg via INTRAVENOUS
  Filled 2021-05-13: qty 2

## 2021-05-13 MED ORDER — KETOROLAC TROMETHAMINE 15 MG/ML IJ SOLN
15.0000 mg | Freq: Once | INTRAMUSCULAR | Status: AC
  Administered 2021-05-13: 15 mg via INTRAVENOUS
  Filled 2021-05-13: qty 1

## 2021-05-13 NOTE — ED Provider Notes (Signed)
Emergency Medicine Provider Triage Evaluation Note  Shalanda Brogden , a 25 y.o. female  was evaluated in triage.  Pt complains of headache.  Review of Systems  Positive: headache Negative: Cough, body aches  Physical Exam  BP 113/61 (BP Location: Right Arm)   Pulse 73   Temp 99 F (37.2 C) (Oral)   Resp 17   LMP 04/27/2021   SpO2 100%  Gen:   Awake, no distress   Resp:  Normal effort  MSK:   Moves extremities without difficulty  Other:  Clear speech  Medical Decision Making  Medically screening exam initiated at 5:12 PM.  Appropriate orders placed.  Laquitha Heslin was informed that the remainder of the evaluation will be completed by another provider, this initial triage assessment does not replace that evaluation, and the importance of remaining in the ED until their evaluation is complete.     Karrie Meres, PA-C 05/13/21 1713    Ernie Avena, MD 05/14/21 1043

## 2021-05-13 NOTE — ED Triage Notes (Signed)
Pt reports migraine and mild blurred vision x2 days.

## 2021-05-13 NOTE — Discharge Instructions (Addendum)
We saw you in the ER for headaches. All the labs and imaging are normal.   Please take motrin round the clock for the next 48 hours, and take other meds prescribed only for break through pain. See your doctor if the pain persists, as you might need better medications or a specialist.

## 2021-05-13 NOTE — ED Provider Notes (Signed)
[facility]-EMERGENCY DEPT Provider Note   CSN: [id number] Arrival date & time: 05/13/21  1338     History Chief Complaint  Patient presents with   Migraine    Valerie Meyers is a 25 y.o. female.  HPI     Pt comes in with cc of headache.  Pt has L sided throbbing, sharp headaches which has been present for 2-3 days. Headaches are worse with light. She reports similar episodes in the past, and typically helps with migraine cocktail.  Please return to the ER if the headache gets severe and in not improving, you have associated new one sided numbness, tingling, weakness or confusion, seizures, poor balance or poor vision.  History reviewed. No pertinent past medical history.  There are no problems to display for this patient.   Past Surgical History:  Procedure Laterality Date   CYST EXCISION     CYSTECTOMY       OB History   No obstetric history on file.     Family History  Family history unknown: Yes    Social History   Tobacco Use   Smoking status: Never   Smokeless tobacco: Never  Vaping Use   Vaping Use: Never used  Substance Use Topics   Alcohol use: Yes   Drug use: Yes    Types: Marijuana    Home Medications Prior to Admission medications   Medication Sig Start Date End Date Taking? Authorizing Provider  albuterol (VENTOLIN HFA) 108 (90 Base) MCG/ACT inhaler Inhale 1-2 puffs into the lungs every 6 (six) hours as needed for wheezing or shortness of breath. 06/12/20   Long, Arlyss Repress, MD  amoxicillin (AMOXIL) 500 MG capsule Take 1 capsule (500 mg total) by mouth 3 (three) times daily. 12/19/20   Rolan Bucco, MD  cetirizine (ZYRTEC) 10 MG tablet Take 1 tablet (10 mg total) by mouth daily. Patient not taking: Reported on 03/25/2020 01/18/20   Couture, Cortni S, PA-C  fluticasone (FLONASE) 50 MCG/ACT nasal spray Place 2 sprays into both nostrils daily. Patient not taking: Reported on 03/25/2020 01/18/20   Couture, Cortni S, PA-C  ibuprofen  (ADVIL) 200 MG tablet Take 200 mg by mouth every 6 (six) hours as needed for headache or moderate pain.    [provider]  metroNIDAZOLE (FLAGYL) 500 MG tablet Take 1 tablet (500 mg total) by mouth 2 (two) times daily. One po bid x 7 days 12/19/20   Rolan Bucco, MD  ondansetron (ZOFRAN ODT) 4 MG disintegrating tablet Take 1 tablet (4 mg total) by mouth every 8 (eight) hours as needed for nausea or vomiting. 06/12/20   Long, Arlyss Repress, MD    Allergies    Other  Review of Systems   Review of Systems  Constitutional:  Positive for activity change.  Eyes:  Positive for photophobia and visual disturbance.  Respiratory:  Negative for shortness of breath.   Cardiovascular:  Negative for chest pain.  Gastrointestinal:  Negative for nausea and vomiting.  Neurological:  Positive for headaches.  All other systems reviewed and are negative.  Physical Exam Updated Vital Signs BP 121/69   Pulse 73   Temp 99 F (37.2 C) (Oral)   Resp 18   LMP 04/27/2021   SpO2 100%   Physical Exam Vitals and nursing note reviewed.  Constitutional:      Appearance: She is well-developed.  HENT:     Head: Atraumatic.  Eyes:     Extraocular Movements: Extraocular movements intact.  Conjunctiva/sclera: Conjunctivae normal.     Pupils: Pupils are equal, round, and reactive to light.  Cardiovascular:     Rate and Rhythm: Normal rate.  Pulmonary:     Effort: Pulmonary effort is normal.  Musculoskeletal:     Cervical back: Normal range of motion and neck supple.  Skin:    General: Skin is warm and dry.  Neurological:     Mental Status: She is alert and oriented to person, place, and time.    ED Results / Procedures / Treatments   Labs (all labs ordered are listed, but only abnormal results are displayed) Labs Reviewed  RESP PANEL BY RT-PCR (FLU A&B, COVID) [id number]    EKG None  Radiology No results found.  Procedures Procedures   Medications Ordered in ED Medications  ketorolac  (TORADOL) 15 MG/ML injection 15 mg (15 mg Intravenous Given 05/13/21 1942)  metoCLOPramide (REGLAN) injection 10 mg (10 mg Intravenous Given 05/13/21 1942)    ED Course  I have reviewed the triage vital signs and the nursing notes.  Pertinent labs & imaging results that were available during my care of the patient were reviewed by me and considered in my medical decision making (see chart for details).    MDM Rules/Calculators/A&P                           Pt comes in with cc of headaches.  Sounds migrainous on hx. Needs outpatient f/u.  No red flags on hx to be concerned for elevated icp, infection.  Reassessment: Upon reassessment, patient reports that the headache has resolved. She continued to have no neurologic complains. Strict return precautions discussed, pt will return to the ER if there is visual complains, seizures, altered mental status, loss of consciousness, dizziness, new focal weakness, or numbness.    Final Clinical Impression(s) / ED Diagnoses Final diagnoses:  Acute nonintractable headache, unspecified headache type    Rx / DC Orders ED Discharge Orders     None        Derwood Kaplan, MD 05/13/21 2339

## 2021-05-28 IMAGING — CT CT ANGIO HEAD
3 of 10 series · 15 of 47 positions shown · IV contrast (omnipaque)
Comparison: None.

CLINICAL DATA: 23-year-old female with persistent unexplained
headache, photosensitivity.

EXAM:
CT ANGIOGRAPHY HEAD
TECHNIQUE: Multidetector CT imaging of the head was performed using the
standard protocol during bolus administration of intravenous
contrast. Multiplanar CT image reconstructions and MIPs were
obtained to evaluate the vascular anatomy.
CONTRAST:  75 mL OMNIPAQUE IOHEXOL 350 MG/ML SOLN

[Series 14: ax thin · axial · 0.34mm/px · z∈[-134,-1]mm · 9 of 156 slices shown]
[im 11/156  brain]
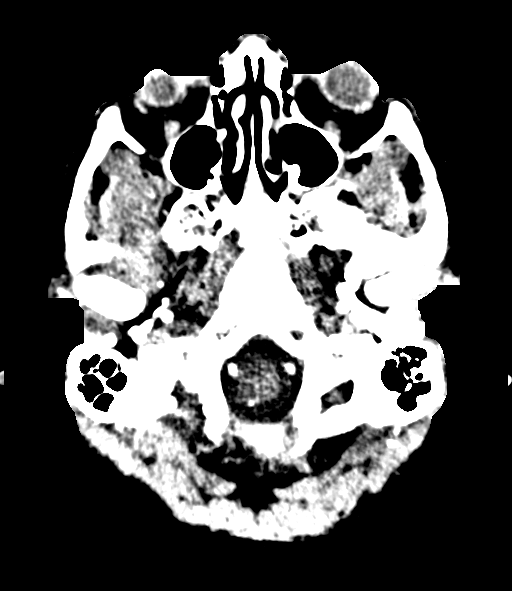
[im 32/156  bone]
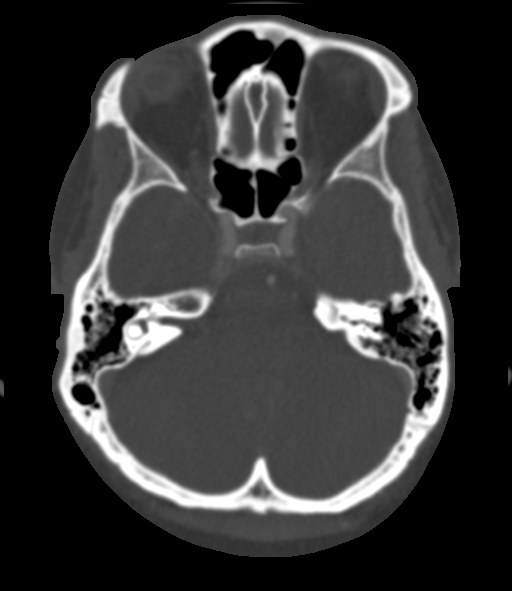
[im 42/156  brain]
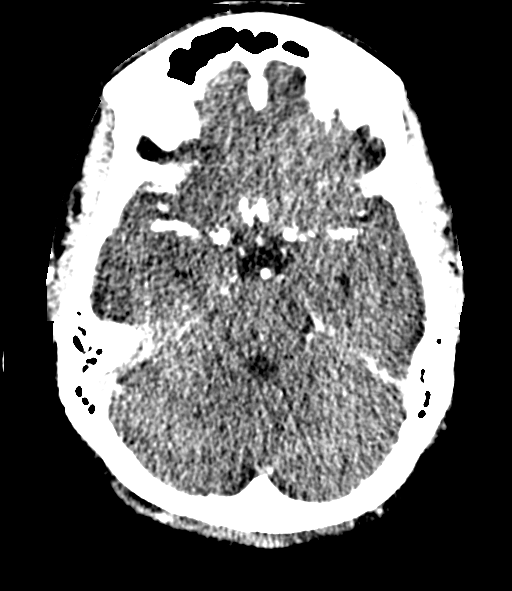
[im 63/156  bone]
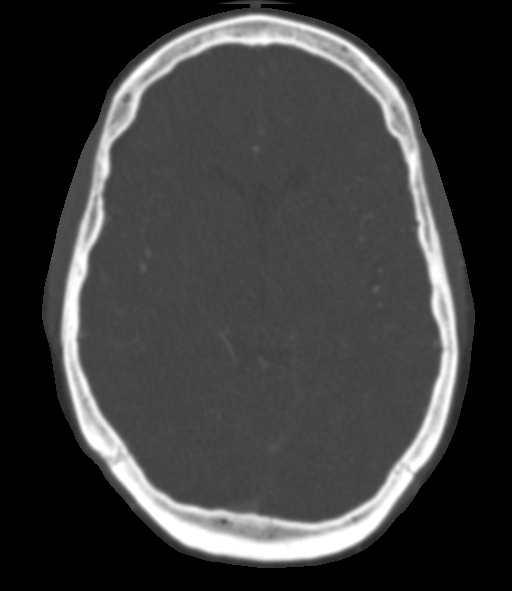
[im 83/156  brain]
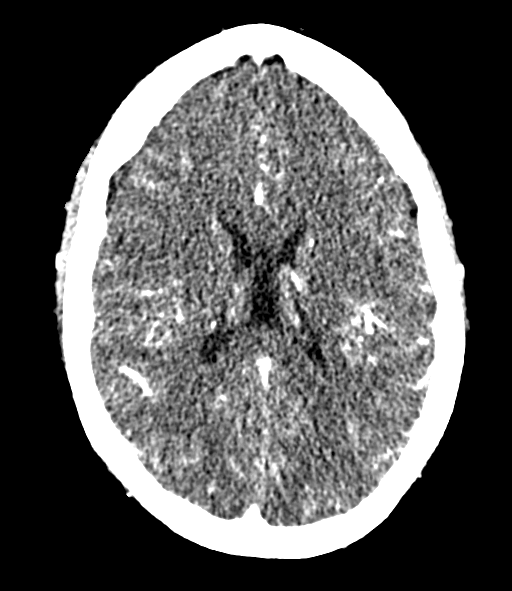
[im 94/156  bone]
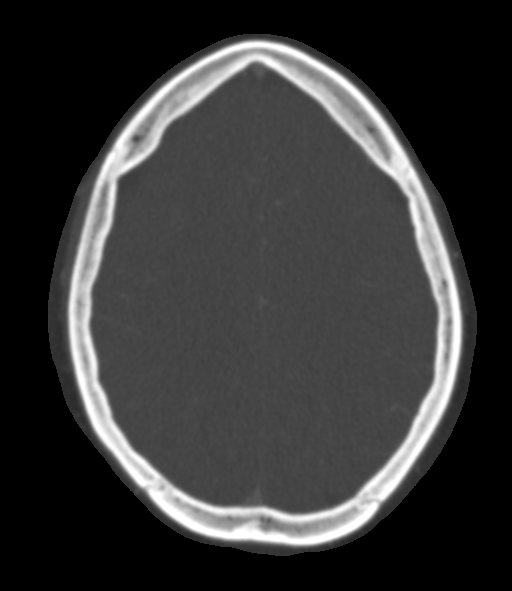
[im 114/156  brain]
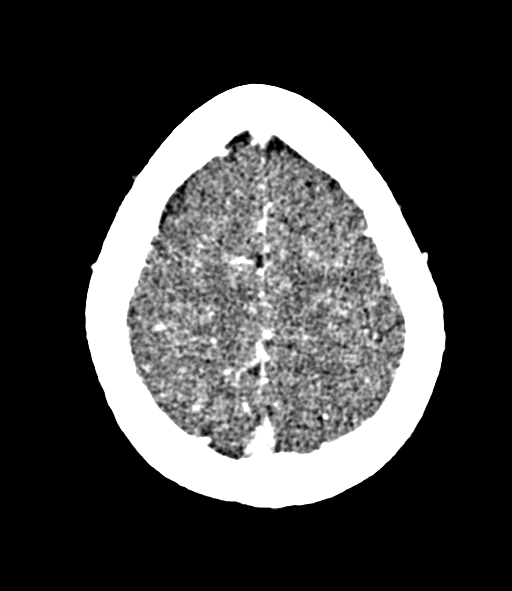
[im 125/156  bone]
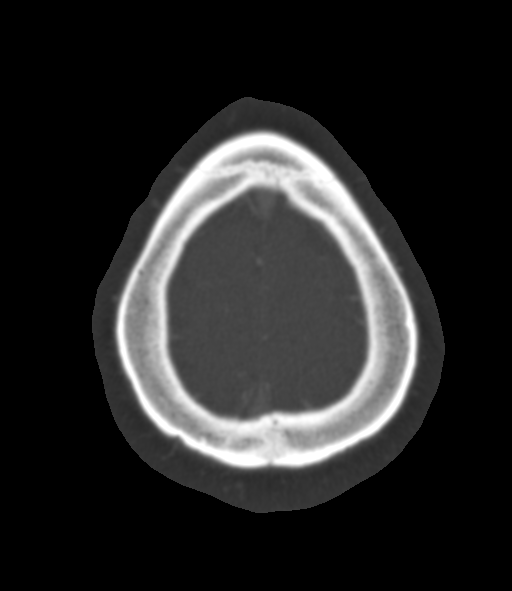
[im 145/156  brain]
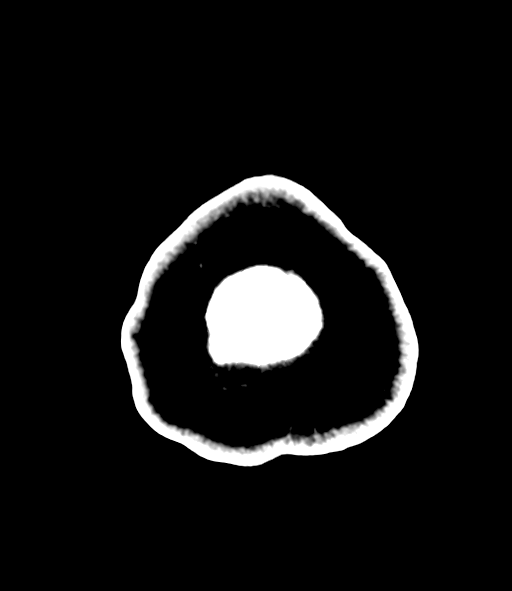

[Series 15: coronal thin · coronal · 0.29mm/px · 3 of 201 slices shown]
[im 41/201  brain]
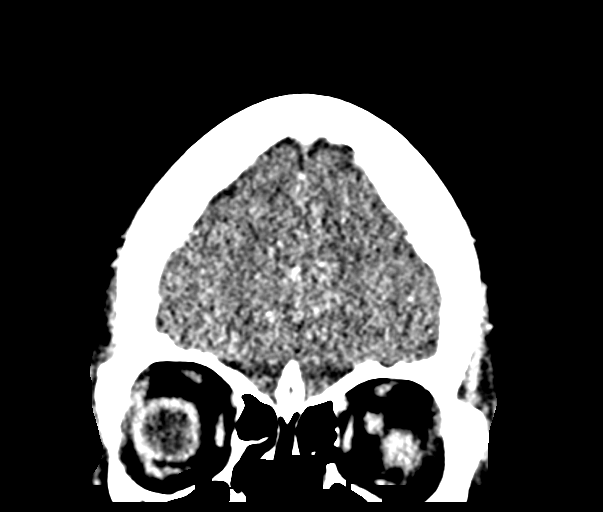
[im 81/201  brain]
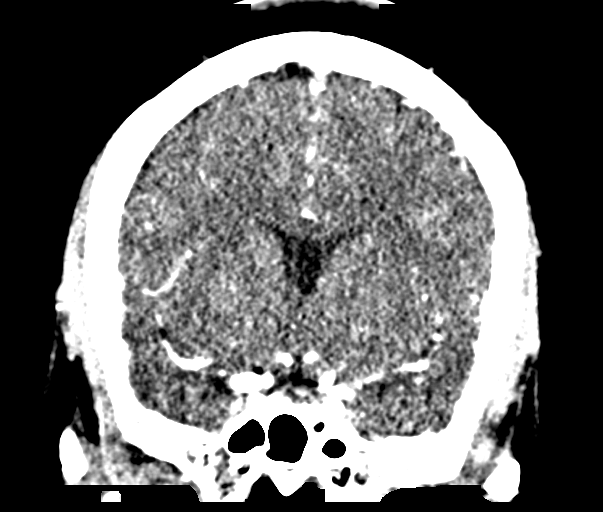
[im 121/201  brain]
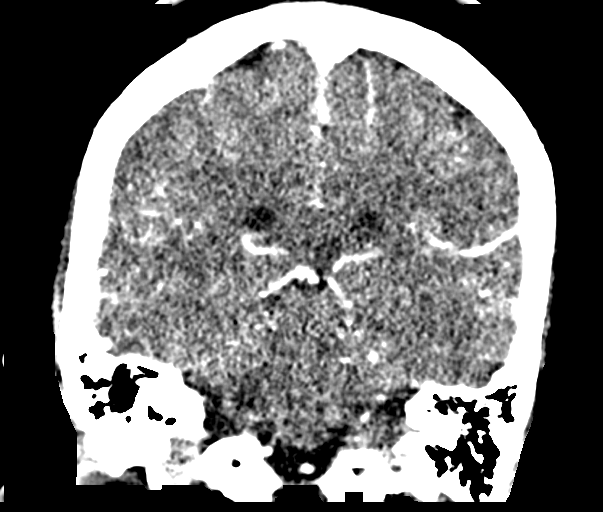

[Series 16: sagittal thin · sagittal · 0.30mm/px · 3 of 176 slices shown]
[im 44/176  brain]
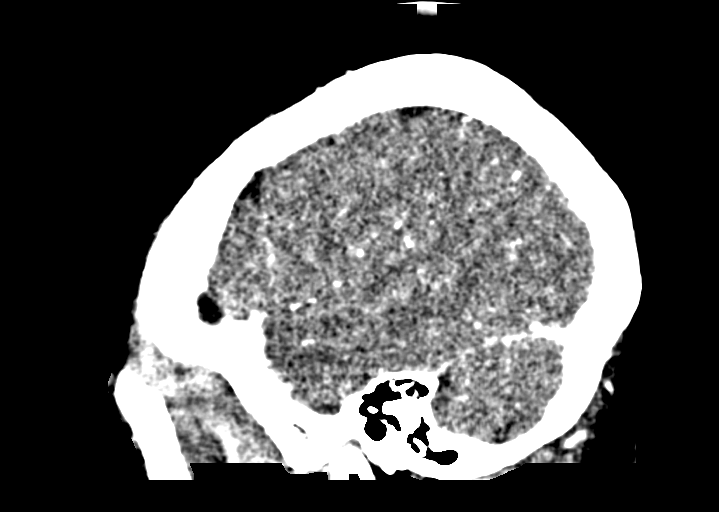
[im 88/176  brain]
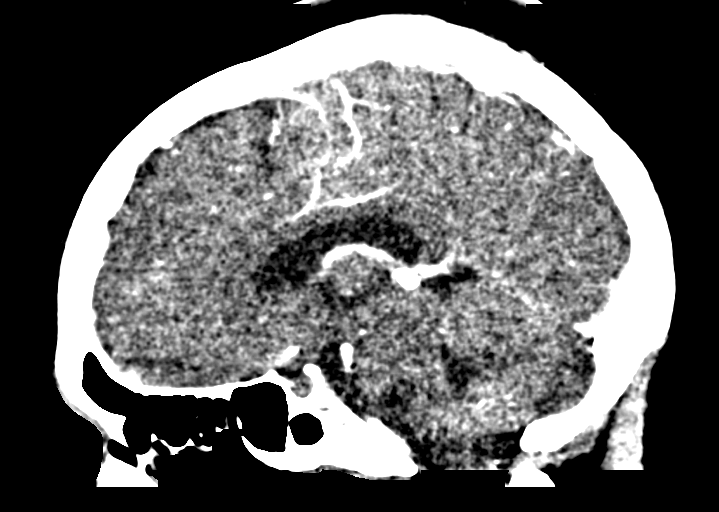
[im 132/176  brain]
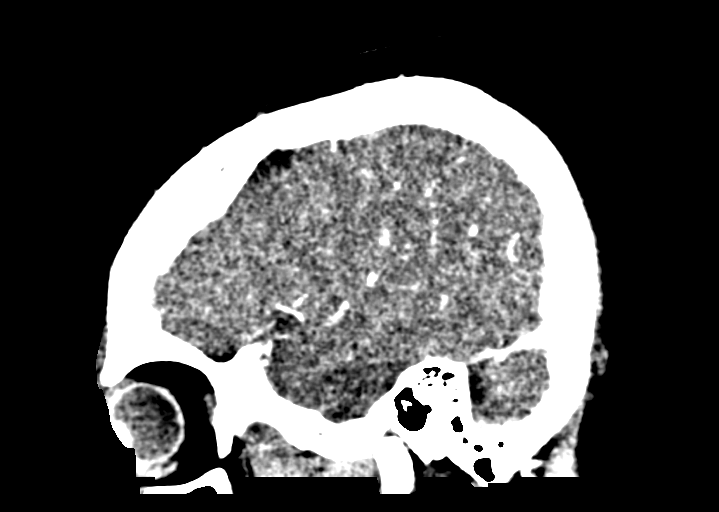

[15 of 47 positions shown; findings below may reference images not displayed]

FINDINGS: CT HEAD

Brain: Cavum septum pellucidum, normal variant. No ventriculomegaly.

No midline shift, mass effect, evidence of mass lesion, intracranial
hemorrhage or evidence of cortically based acute infarction.
Gray-white matter differentiation is within normal limits throughout
the brain.

Calvarium and skull base: Negative.

Paranasal sinuses: Visualized paranasal sinuses and mastoids are
clear.

Orbits: Visualized orbits and scalp soft tissues are within normal
limits.

CTA HEAD

Posterior circulation: Codominant distal vertebral arteries are
normal to the vertebrobasilar junction. Right PICA origin is visible
and patent. Patent basilar artery without stenosis. Patent AICA, SCA
and PCA origins. Small posterior communicating arteries. Bilateral
PCA branches are within normal limits.

Anterior circulation: Patent ICA siphons with no plaque or stenosis.
Normal posterior communicating artery origins. Patent carotid
termini. Normal MCA and ACA origins. Anterior communicating artery
is normal. Bilateral ACA branches are within normal limits. Left MCA
M1, bifurcation, and left MCA branches are within normal limits.
Right MCA M1, bifurcation, and right MCA branches are within normal
limits.

Venous sinuses: Adequate contrast timing, patent.

Anatomic variants: None.

Review of the MIP images confirms the above findings
IMPRESSION: Normal intracranial CTA, and normal CT appearance of the brain. No
explanation for headache.

## 2021-12-12 ENCOUNTER — Emergency Department (HOSPITAL_COMMUNITY)
Admission: EM | Admit: 2021-12-12 | Discharge: 2021-12-12 | Disposition: A | Discharge status: Home / Self Care | Payer: Self-pay | Attending: Emergency Medicine | Admitting: Emergency Medicine

## 2021-12-12 ENCOUNTER — Other Ambulatory Visit: Payer: Self-pay

## 2021-12-12 ENCOUNTER — Encounter (HOSPITAL_COMMUNITY): Payer: Self-pay | Admitting: Emergency Medicine

## 2021-12-12 DIAGNOSIS — J069 Acute upper respiratory infection, unspecified: Secondary | ICD-10-CM | POA: Insufficient documentation

## 2021-12-12 LAB — GROUP A STREP BY PCR: Group A Strep by PCR: NOT DETECTED

## 2021-12-12 NOTE — ED Triage Notes (Addendum)
Reports a cold about a week ago, now her throat is sore, hurts to swallow and pain increases at night. R sided tonsil swelling noted.  ?

## 2021-12-12 NOTE — Discharge Instructions (Signed)
It appears that you have an upper respiratory infection or other common cold.  To help your symptoms, use Robitussin-DM for cough, Tylenol every 4 hours for fever or pain, throat lozenges or salt water gargles for sore throat.  See the doctor of your choice as needed for problems. ?

## 2021-12-12 NOTE — ED Provider Notes (Signed)
?[facility]-EMERGENCY DEPT ?Provider Note ? ? ?CSN: [id number] ?Arrival date & time: 12/12/21  1226 ? ?  ? ?History ? ?No chief complaint on file. ? ? ?Valerie Meyers is a 26 y.o. female. ? ?HPI ?She presents for evaluation of sore throat and chest congestion with cough for 2 days.  She took a COVID test yesterday that was negative.  She has had COVID vaccines.  No known sick contacts.  She denies fever, chills, nausea or vomiting. ?  ? ?Home Medications ?Prior to Admission medications   ?Medication Sig Start Date End Date Taking? Authorizing Provider  ?albuterol (VENTOLIN HFA) 108 (90 Base) MCG/ACT inhaler Inhale 1-2 puffs into the lungs every 6 (six) hours as needed for wheezing or shortness of breath. 06/12/20   Long, Arlyss Repress, MD  ?amoxicillin (AMOXIL) 500 MG capsule Take 1 capsule (500 mg total) by mouth 3 (three) times daily. 12/19/20   Rolan Bucco, MD  ?cetirizine (ZYRTEC) 10 MG tablet Take 1 tablet (10 mg total) by mouth daily. ?Patient not taking: Reported on 03/25/2020 01/18/20   Couture, Cortni S, PA-C  ?fluticasone (FLONASE) 50 MCG/ACT nasal spray Place 2 sprays into both nostrils daily. ?Patient not taking: Reported on 03/25/2020 01/18/20   Couture, Cortni S, PA-C  ?ibuprofen (ADVIL) 200 MG tablet Take 200 mg by mouth every 6 (six) hours as needed for headache or moderate pain.    [provider]  ?metroNIDAZOLE (FLAGYL) 500 MG tablet Take 1 tablet (500 mg total) by mouth 2 (two) times daily. One po bid x 7 days 12/19/20   Rolan Bucco, MD  ?ondansetron (ZOFRAN ODT) 4 MG disintegrating tablet Take 1 tablet (4 mg total) by mouth every 8 (eight) hours as needed for nausea or vomiting. 06/12/20   Long, Arlyss Repress, MD  ?   ? ?Allergies    ?Other   ? ?Review of Systems   ?Review of Systems ? ?Physical Exam ?Updated Vital Signs ?BP 129/70 (BP Location: Right Arm)   Pulse 90   Temp 98.5 ?F (36.9 ?C) (Oral)   Resp 20   Ht 5' 9.75" (1.772 m)   Wt 98 kg   SpO2 100%   BMI 31.22 kg/m?   ?Physical Exam ?Vitals and nursing note reviewed.  ?Constitutional:   ?   General: She is not in acute distress. ?   Appearance: She is well-developed. She is obese. She is not ill-appearing, toxic-appearing or diaphoretic.  ?HENT:  ?   Head: Normocephalic and atraumatic.  ?   Right Ear: External ear normal.  ?   Left Ear: External ear normal.  ?   Mouth/Throat:  ?   Mouth: Mucous membranes are moist.  ?   Pharynx: Oropharynx is clear. No oropharyngeal exudate or posterior oropharyngeal erythema.  ?Eyes:  ?   Conjunctiva/sclera: Conjunctivae normal.  ?   Pupils: Pupils are equal, round, and reactive to light.  ?Neck:  ?   Trachea: Phonation normal.  ?Cardiovascular:  ?   Rate and Rhythm: Normal rate and regular rhythm.  ?   Heart sounds: Normal heart sounds.  ?Pulmonary:  ?   Effort: Pulmonary effort is normal.  ?   Breath sounds: Normal breath sounds.  ?Abdominal:  ?   Palpations: Abdomen is soft.  ?   Tenderness: There is no abdominal tenderness.  ?Musculoskeletal:     ?   General: Normal range of motion.  ?   Cervical back: Normal range of motion and neck supple.  ?Skin: ?  General: Skin is warm and dry.  ?Neurological:  ?   Mental Status: She is alert and oriented to person, place, and time.  ?   Cranial Nerves: No cranial nerve deficit.  ?   Sensory: No sensory deficit.  ?   Motor: No abnormal muscle tone.  ?   Coordination: Coordination normal.  ?Psychiatric:     ?   Mood and Affect: Mood normal.     ?   Behavior: Behavior normal.     ?   Thought Content: Thought content normal.     ?   Judgment: Judgment normal.  ? ? ?ED Results / Procedures / Treatments   ?Labs ?(all labs ordered are listed, but only abnormal results are displayed) ?Labs Reviewed  ?GROUP A STREP BY PCR  ? ? ?EKG ?None ? ?Radiology ?No results found. ? ?Procedures ?Procedures  ? ? ?Medications Ordered in ED ?Medications - No data to display ? ?ED Course/ Medical Decision Making/ A&P ?  ?                        ?Medical Decision Making ?Pain  presenting with signs symptoms of URI.  She states she tested negative for COVID.  She is nontoxic in appearance with normal vital signs.  Clinically normal exam, no evidence for tonsillitis or pharyngeal infection.  Suspect URI causing her symptoms.  Following initial discharge she was agreeable then later told the nurse that she wanted a streptococcal test.  This was ordered by the nurse. ? ?Amount and/or Complexity of Data Reviewed ?Independent Historian:  ?   Details: She is a cogent historian ?Labs: ordered. ?   Details: Strep test-negative ? ?Risk ?OTC drugs. ?Decision regarding hospitalization. ?Risk Details: Signs and symptoms of URI which can be treated symptomatically.  Recommended symptomatic care with Tylenol, Robitussin-DM for cough and throat lozenges.  Release for work given for 1 day.  She does not require further ED evaluation or hospitalization at this time. ? ? ? ? ? ? ? ? ? ? ?Final Clinical Impression(s) / ED Diagnoses ?Final diagnoses:  ?Viral upper respiratory tract infection  ? ? ?Rx / DC Orders ?ED Discharge Orders   ? ? None  ? ?  ? ? ?  ?Mancel Bale, MD ?12/12/21 1417 ? ?

## 2022-08-04 ENCOUNTER — Other Ambulatory Visit: Payer: Self-pay

## 2022-08-04 ENCOUNTER — Emergency Department (HOSPITAL_COMMUNITY)
Admission: EM | Admit: 2022-08-04 | Discharge: 2022-08-04 | Discharge status: Against Medical Advice | Payer: Self-pay | Attending: Emergency Medicine | Admitting: Emergency Medicine

## 2022-08-04 ENCOUNTER — Encounter (HOSPITAL_COMMUNITY): Payer: Self-pay | Admitting: Emergency Medicine

## 2022-08-04 ENCOUNTER — Emergency Department (HOSPITAL_COMMUNITY): Payer: Self-pay

## 2022-08-04 DIAGNOSIS — R1030 Lower abdominal pain, unspecified: Secondary | ICD-10-CM | POA: Insufficient documentation

## 2022-08-04 DIAGNOSIS — Z5321 Procedure and treatment not carried out due to patient leaving prior to being seen by health care provider: Secondary | ICD-10-CM | POA: Insufficient documentation

## 2022-08-04 LAB — URINALYSIS, ROUTINE W REFLEX MICROSCOPIC
Bilirubin Urine: NEGATIVE
Glucose, UA: NEGATIVE mg/dL
Hgb urine dipstick: NEGATIVE
Ketones, ur: NEGATIVE mg/dL
Leukocytes,Ua: NEGATIVE
Nitrite: NEGATIVE
Protein, ur: NEGATIVE mg/dL
Specific Gravity, Urine: 1.013 (ref 1.005–1.030)
pH: 5 (ref 5.0–8.0)

## 2022-08-04 LAB — CBC WITH DIFFERENTIAL/PLATELET
Abs Immature Granulocytes: 0.06 10*3/uL (ref 0.00–0.07)
Basophils Absolute: 0 10*3/uL (ref 0.0–0.1)
Basophils Relative: 0 %
Eosinophils Absolute: 0 10*3/uL (ref 0.0–0.5)
Eosinophils Relative: 0 %
HCT: 36.6 % (ref 36.0–46.0)
Hemoglobin: 11.4 g/dL — ABNORMAL LOW (ref 12.0–15.0)
Immature Granulocytes: 0 %
Lymphocytes Relative: 9 %
Lymphs Abs: 1.3 10*3/uL (ref 0.7–4.0)
MCH: 24.9 pg — ABNORMAL LOW (ref 26.0–34.0)
MCHC: 31.1 g/dL (ref 30.0–36.0)
MCV: 80.1 fL (ref 80.0–100.0)
Monocytes Absolute: 0.1 10*3/uL (ref 0.1–1.0)
Monocytes Relative: 1 %
Neutro Abs: 13.4 10*3/uL — ABNORMAL HIGH (ref 1.7–7.7)
Neutrophils Relative %: 90 %
Platelets: 309 10*3/uL (ref 150–400)
RBC: 4.57 MIL/uL (ref 3.87–5.11)
RDW: 15.4 % (ref 11.5–15.5)
WBC: 15 10*3/uL — ABNORMAL HIGH (ref 4.0–10.5)
nRBC: 0 % (ref 0.0–0.2)

## 2022-08-04 LAB — COMPREHENSIVE METABOLIC PANEL
ALT: 29 U/L (ref 0–44)
AST: 44 U/L — ABNORMAL HIGH (ref 15–41)
Albumin: 3.8 g/dL (ref 3.5–5.0)
Alkaline Phosphatase: 73 U/L (ref 38–126)
Anion gap: 10 (ref 5–15)
BUN: 8 mg/dL (ref 6–20)
CO2: 22 mmol/L (ref 22–32)
Calcium: 9.2 mg/dL (ref 8.9–10.3)
Chloride: 104 mmol/L (ref 98–111)
Creatinine, Ser: 0.82 mg/dL (ref 0.44–1.00)
GFR, Estimated: >60 mL/min (ref >60)
Glucose, Bld: 126 mg/dL — ABNORMAL HIGH (ref 70–99)
Potassium: 3.7 mmol/L (ref 3.5–5.1)
Sodium: 136 mmol/L (ref 135–145)
Total Bilirubin: 0.5 mg/dL (ref 0.3–1.2)
Total Protein: 7.3 g/dL (ref 6.5–8.1)

## 2022-08-04 LAB — I-STAT BETA HCG BLOOD, ED (MC, WL, AP ONLY): I-stat hCG, quantitative: <5 m[IU]/mL (ref <5)

## 2022-08-04 LAB — LIPASE, BLOOD: Lipase: 28 U/L (ref 11–51)

## 2022-08-04 NOTE — ED Provider Triage Note (Signed)
Emergency Medicine Provider Triage Evaluation Note  Jil Penland , a 26 y.o. female  was evaluated in triage.  Pt complains of lower abdominal/pelvic pain.  Reports burning pain in lower abdomen and radiating into back.  Hx of similar related to ruptured ovarian cysts requiring surgery 2 years ago in [place], [place].  Review of Systems  Positive: Abdominal pain Negative: fever  Physical Exam  BP 114/64 (BP Location: Left Arm)   Pulse (!) 115   Temp 99.2 F (37.3 C) (Oral)   Resp 17   SpO2 98%   Gen:   Awake, no distress   Resp:  Normal effort  MSK:   Moves extremities without difficulty  Other:    Medical Decision Making  Medically screening exam initiated at 3:45 AM.  Appropriate orders placed.  Shanetta Nicolls was informed that the remainder of the evaluation will be completed by another provider, this initial triage assessment does not replace that evaluation, and the importance of remaining in the ED until their evaluation is complete.  Abdominal pain.  Hx of ovarian cysts with similar symptoms.  Will check labs, [place].   Larene Pickett, PA-C 08/04/22 [phone]

## 2022-08-04 NOTE — ED Triage Notes (Signed)
Pt c/o abdominal pain x 3 hrs, pain radiates to her back. Reports hx surgery for ovarian cyst.

## 2022-08-04 NOTE — ED Notes (Signed)
Pt informed this tech she called her ride and is leaving

## 2022-11-02 ENCOUNTER — Encounter: Payer: Self-pay | Admitting: Medical

## 2022-11-02 ENCOUNTER — Ambulatory Visit (INDEPENDENT_AMBULATORY_CARE_PROVIDER_SITE_OTHER): Payer: 59 | Admitting: Medical

## 2022-11-02 VITALS — BP 110/68 | HR 80 | Ht 69.5 in | Wt 274.8 lb

## 2022-11-02 DIAGNOSIS — K921 Melena: Secondary | ICD-10-CM | POA: Diagnosis not present

## 2022-11-02 DIAGNOSIS — N946 Dysmenorrhea, unspecified: Secondary | ICD-10-CM

## 2022-11-02 DIAGNOSIS — N920 Excessive and frequent menstruation with regular cycle: Secondary | ICD-10-CM | POA: Diagnosis not present

## 2022-11-02 DIAGNOSIS — K5909 Other constipation: Secondary | ICD-10-CM

## 2022-11-02 MED ORDER — HYDROCORTISONE ACETATE 25 MG RE SUPP: 25.0000 mg | Freq: Two times a day (BID) | RECTAL | 0 refills | Status: DC

## 2022-11-02 MED ORDER — POLYETHYLENE GLYCOL 3350 17 GM/SCOOP PO POWD: 17.0000 g | Freq: Two times a day (BID) | ORAL | 1 refills | Status: DC | PRN

## 2022-11-02 NOTE — Patient Instructions (Addendum)
Recommendations:  Blood in stool, chronic constipation Drink at least 80 ounces of water daily Try to get at least 25 g of fiber in the diet daily I prescribed MiraLAX today, you can use MiraLAX 1 capful daily in a beverage to help with constipation.  This changes the amount of water absorption in the colon When you have a episode of blood in the stool like he had yesterday, you can use rectal suppository twice a day for couple days in a row to calm down very likely internal hemorrhoids If this medicine is too expensive or not covered by insurance let me know and I will try to send something else to the pharmacy. If you continue to get blood in the stool over the next month or 2 or more frequent episodes and I may need to send you to gastroenterology for further evaluation  Heavy periods, painful periods I am placing referral today to gynecology.  Expect a phone call within the next week numbness    Constipation, Adult Constipation is when a person has trouble pooping (having a bowel movement). When you have this condition, you may poop fewer than 3 times a week. Your poop (stool) may also be dry, hard, or bigger than normal. Follow these instructions at home: Eating and drinking  Eat foods that have a lot of fiber, such as: Fresh fruits and vegetables. Whole grains. Beans. Eat less of foods that are low in fiber and high in fat and sugar, such as: [place] fries. Hamburgers. Cookies. Candy. Soda. Drink enough fluid to keep your pee (urine) pale yellow. General instructions Exercise regularly or as told by your doctor. Try to do 150 minutes of exercise each week. Go to the restroom when you feel like you need to poop. Do not hold it in. Take over-the-counter and prescription medicines only as told by your doctor. These include any fiber supplements. When you poop: Do deep breathing while relaxing your lower belly (abdomen). Relax your pelvic floor. The pelvic floor is a group of  muscles that support the rectum, bladder, and intestines (as well as the uterus in women). Watch your condition for any changes. Tell your doctor if you notice any. Keep all follow-up visits as told by your doctor. This is important. Contact a doctor if: You have pain that gets worse. You have a fever. You have not pooped for 4 days. You vomit. You are not hungry. You lose weight. You are bleeding from the opening of the butt (anus). You have thin, pencil-like poop. Get help right away if: You have a fever, and your symptoms suddenly get worse. You leak poop or have blood in your poop. Your belly feels hard or bigger than normal (bloated). You have very bad belly pain. You feel dizzy or you faint. Summary Constipation is when a person poops fewer than 3 times a week, has trouble pooping, or has poop that is dry, hard, or bigger than normal. Eat foods that have a lot of fiber. Drink enough fluid to keep your pee (urine) pale yellow. Take over-the-counter and prescription medicines only as told by your doctor. These include any fiber supplements. This information is not intended to replace advice given to you by your health care provider. Make sure you discuss any questions you have with your health care provider. Document Revised: 08/15/2019 Document Reviewed: 08/15/2019 Elsevier Patient Education  [place].

## 2022-11-02 NOTE — Progress Notes (Signed)
Subjective: Chief Complaint  Patient presents with   new pt    New pt get established. When having BM will bleed stressed. Having scars on legs- dry patches. Due for a pap smear, when period comes on- severe cramps and swelling in feets and wrist   Here as a new patient to establish care.   No recent PCP.  Been in [place] 3 years, born in [place].  Getting some blood in stool, started in 2021.  Went to hospital initially about this 3 years ago.  Had large ovarian cyst at the time.   Had surgery for that. The blood in stool calmed down for a while but starting to flare up again.  In past few months, has had about 3 episodes of blood in stool.   Stool is bright red, on toilet paper and in the bowl, and mixed in sometimes with poop.   Doesn't have hx/o hemorrhoid. Has BM maybe 4 times per week.  Has constipation, been dealing with this all her life.  Takes nothing for constipation.  No family history of colon cancer.     Works in [employer] at [employer].  She notes heavy painful periods.  Her periods are regular they are painful.  Would like some type of treatment for this.  She is using ibuprofen and Midol regularly at higher doses than baseline dose and that still does not help.  She is considering IUD.  She has been on birth control in the past.  She was on Depo-Provera shots at 1 point but this messed with her hormones.  She is not worried about pregnancy.  She has a female partner.  She has no prior pregnancy.  She had a dermoid ovarian cyst removed in 2020-2021 time frame.  No other aggravating or relieving factors. No other complaint.  Past Medical History:  Diagnosis Date   Allergic rhinitis    Migraine    No current outpatient medications on file prior to visit.   No current facility-administered medications on file prior to visit.   ROS as in subjective   Objective: BP 110/68   Pulse 80   Ht 5' 9.5" (1.765 m)   Wt 274 lb 12.8 oz (124.6 kg)   LMP 10/17/2022   BMI 40.00  kg/m   General appearence: alert, no distress, WD/WN,  Heart: RRR, normal S1, S2, no murmurs Lungs: CTA bilaterally, no wheezes, rhonchi, or rales Abdomen: +bs, soft, tender throughout lower abdomen, non distended, no masses, no hepatomegaly, no splenomegaly Pulses: 2+ symmetric, upper and lower extremities, normal cap refill GU/rectal - deferred/declined    Assessment: Encounter Diagnoses  Name Primary?   Dysmenorrhea Yes   Menorrhagia with regular cycle    Blood in stool    Chronic constipation      Plan Blood in stool, chronic constipation - blood in stool likely due to internal hemorrhoid.  No rectal pain, no symptoms of inflammatory bowel disease. Drink at least 80 ounces of water daily Try to get at least 25 g of fiber in the diet daily I prescribed MiraLAX today, you can use MiraLAX 1 capful daily in a beverage to help with constipation.  This changes the amount of water absorption in the colon When you have a episode of blood in the stool like he had yesterday, you can use rectal suppository twice a day for couple days in a row to calm down very likely internal hemorrhoids If this medicine is too expensive or not covered by insurance let me  know and I will try to send something else to the pharmacy. If you continue to get blood in the stool over the next month or 2 or more frequent episodes and I may need to send you to gastroenterology for further evaluation  Heavy periods, painful periods I am placing referral today to gynecology.  Expect a phone call within the next week numbness    Valerie Meyers was seen today for new pt.  Diagnoses and all orders for this visit:  Dysmenorrhea -     Ambulatory referral to Gynecology  Menorrhagia with regular cycle -     Ambulatory referral to Gynecology  Blood in stool  Chronic constipation  Other orders -     polyethylene glycol powder (GLYCOLAX/MIRALAX) 17 GM/SCOOP powder; Take 17 g by mouth 2 (two) times daily as needed. -      hydrocortisone (ANUSOL-HC) 25 MG suppository; Place 1 suppository (25 mg total) rectally 2 (two) times daily.    F/u soon for fasting physical

## 2022-11-19 ENCOUNTER — Encounter: Payer: Self-pay | Admitting: Medical

## 2022-11-19 ENCOUNTER — Other Ambulatory Visit (HOSPITAL_COMMUNITY)
Admission: RE | Admit: 2022-11-19 | Discharge: 2022-11-19 | Disposition: A | Discharge status: Home / Self Care | Payer: PRIVATE HEALTH INSURANCE | Source: Ambulatory Visit | Attending: Medical | Admitting: Medical

## 2022-11-19 ENCOUNTER — Ambulatory Visit (INDEPENDENT_AMBULATORY_CARE_PROVIDER_SITE_OTHER): Payer: 59 | Admitting: Medical

## 2022-11-19 VITALS — BP 120/80 | HR 78 | Ht 69.5 in | Wt 276.8 lb

## 2022-11-19 DIAGNOSIS — Z124 Encounter for screening for malignant neoplasm of cervix: Secondary | ICD-10-CM | POA: Insufficient documentation

## 2022-11-19 DIAGNOSIS — Z113 Encounter for screening for infections with a predominantly sexual mode of transmission: Secondary | ICD-10-CM | POA: Diagnosis not present

## 2022-11-19 DIAGNOSIS — Z0000 Encounter for general adult medical examination without abnormal findings: Secondary | ICD-10-CM | POA: Diagnosis present

## 2022-11-19 DIAGNOSIS — Z131 Encounter for screening for diabetes mellitus: Secondary | ICD-10-CM

## 2022-11-19 DIAGNOSIS — K5909 Other constipation: Secondary | ICD-10-CM

## 2022-11-19 DIAGNOSIS — Z1329 Encounter for screening for other suspected endocrine disorder: Secondary | ICD-10-CM

## 2022-11-19 DIAGNOSIS — Z13220 Encounter for screening for lipoid disorders: Secondary | ICD-10-CM

## 2022-11-19 DIAGNOSIS — R32 Unspecified urinary incontinence: Secondary | ICD-10-CM

## 2022-11-19 LAB — POCT URINALYSIS DIP (PROADVANTAGE DEVICE)
Bilirubin, UA: NEGATIVE
Blood, UA: NEGATIVE
Glucose, UA: NEGATIVE mg/dL
Ketones, POC UA: NEGATIVE mg/dL
Leukocytes, UA: NEGATIVE
Nitrite, UA: NEGATIVE
Protein Ur, POC: NEGATIVE mg/dL
Specific Gravity, Urine: 1.02
Urobilinogen, Ur: NEGATIVE
pH, UA: 6 (ref 5.0–8.0)

## 2022-11-19 LAB — LIPID PANEL

## 2022-11-19 NOTE — Progress Notes (Signed)
Only tdap and hep A was in [facility] 09/10/05

## 2022-11-19 NOTE — Progress Notes (Signed)
Subjective:   HPI  Valerie Meyers is a 27 y.o. female who presents for Chief Complaint  Patient presents with   fasting cpe    Fasting cpe, no bladder control, needs pap smear,     Patient Care Team: Yeraldi Fidler, Leward Quan as PCP - General (Family Medicine) Sees dentist Sees eye doctor  Concerns: I saw her recently for new patient establish care, heavy periods, and some blood in stool  Gynecological history: No prior pregnancies.   Periods are regular.   Periods are heavy.  Last pap, unsure.  Has some problems with incontinence for years.   Sometimes odor.  No blood.  More of an urge incontinence.   Sometimes gets incontinence with laugh or cough.   Does drink quite a bit of caffeine.  Drinks a good amount of water.   Reviewed their medical, surgical, family, social, medication, and allergy history and updated chart as appropriate.  Past Medical History:  Diagnosis Date   Allergic rhinitis    Migraine     Family History  Problem Relation Age of Onset   Depression Mother    Sarcoidosis Mother    Irritable bowel syndrome Mother    Irritable bowel syndrome Sister    Asthma Sister      Current Outpatient Medications:    hydrocortisone (ANUSOL-HC) 25 MG suppository, Place 1 suppository (25 mg total) rectally 2 (two) times daily. (Patient not taking: Reported on 11/19/2022), Disp: 12 suppository, Rfl: 0   polyethylene glycol powder (GLYCOLAX/MIRALAX) 17 GM/SCOOP powder, Take 17 g by mouth 2 (two) times daily as needed. (Patient not taking: Reported on 11/19/2022), Disp: 3350 g, Rfl: 1  Allergies  Allergen Reactions   Other     Bandaid tears skin      Review of Systems  Constitutional:  Negative for chills, fever, malaise/fatigue and weight loss.  HENT:  Positive for hearing loss. Negative for congestion, ear pain, sore throat and tinnitus.   Eyes:  Negative for blurred vision, pain and redness.  Respiratory:  Negative for cough, hemoptysis and shortness of breath.    Cardiovascular:  Negative for chest pain, palpitations, orthopnea, claudication and leg swelling.  Gastrointestinal:  Positive for blood in stool. Negative for abdominal pain, constipation, diarrhea, nausea and vomiting.       Belching  Genitourinary:  Positive for urgency. Negative for dysuria, flank pain, frequency and hematuria.  Musculoskeletal:  Negative for falls, joint pain and myalgias.  Skin:  Negative for itching and rash.  Neurological:  Negative for dizziness, tingling, speech change, weakness and headaches.  Endo/Heme/Allergies:  Negative for polydipsia. Does not bruise/bleed easily.  Psychiatric/Behavioral:  Negative for depression and memory loss. The patient is not nervous/anxious and does not have insomnia.        11/02/2022   12:09 PM  Depression screen PHQ 2/9  Decreased Interest 0  Down, Depressed, Hopeless 0  PHQ - 2 Score 0       Objective:  BP 120/80   Pulse 78   Ht 5' 9.5" (1.765 m)   Wt 276 lb 12.8 oz (125.6 kg)   LMP 10/17/2022   BMI 40.29 kg/m   General appearance: alert, no distress, WD/WN, African American female Skin: large brown flat 9 cm x 8cm on right mid abdomen, large tattoo left lower lateral leg, tattoo right superior anterior hip HEENT: normocephalic, conjunctiva/corneas normal, sclerae anicteric, PERRLA, EOMi, nares patent, no discharge or erythema, pharynx normal Oral cavity: MMM, tongue normal, teeth normal Neck: supple, no lymphadenopathy, no  thyromegaly, no masses, normal ROM, no bruits Chest: non tender, normal shape and expansion Heart: RRR, normal S1, S2, no murmurs Lungs: CTA bilaterally, no wheezes, rhonchi, or rales Abdomen: +bs, soft, non tender, non distended, no masses, no hepatomegaly, no splenomegaly, no bruits Back: non tender, normal ROM, no scoliosis Musculoskeletal: upper extremities non tender, no obvious deformity, normal ROM throughout, lower extremities non tender, no obvious deformity, normal ROM  throughout Extremities: no edema, no cyanosis, no clubbing Pulses: 2+ symmetric, upper and lower extremities, normal cap refill Neurological: alert, oriented x 3, CN2-12 intact, strength normal upper extremities and lower extremities, sensation normal throughout, DTRs 2+ throughout, no cerebellar signs, gait normal Psychiatric: normal affect, behavior normal, pleasant  Breast: nontender, no masses or lumps, no skin changes, no nipple discharge or inversion, no axillary lymphadenopathy Gyn: Normal external genitalia without lesions, vagina with normal mucosa, cervix without lesions, no cervical motion tenderness, no abnormal vaginal discharge.  Uterus and adnexa not enlarged, nontender, no masses.  Pap performed.  Exam chaperoned by nurse. Rectal: anus normal appearing   Assessment and Plan :   Encounter Diagnoses  Name Primary?   Encounter for health maintenance examination in adult Yes   Screening for diabetes mellitus    Screening for lipid disorders    Screen for STD (sexually transmitted disease)    Urinary incontinence, unspecified type    Screening for cervical cancer    Screening for thyroid disorder    Chronic constipation      This visit was a preventative care visit, also known as wellness visit or routine physical.   Topics typically include healthy lifestyle, diet, exercise, preventative care, vaccinations, sick and well care, proper use of emergency dept and after hours care, as well as other concerns.     Recommendations: Continue to return yearly for your annual wellness and preventative care visits.  This gives [place] a chance to discuss healthy lifestyle, exercise, vaccinations, review your chart record, and perform screenings where appropriate.  I recommend you see your eye doctor yearly for routine vision care.  I recommend you see your dentist yearly for routine dental care including hygiene visits twice yearly.   Vaccination recommendations were reviewed  There  is no immunization history on file for this patient.  She notes tetanus booster 3 years ago.    Screening for cancer: Colon cancer screening: Age 61  Breast cancer screening: You should perform a self breast exam monthly.   We reviewed recommendations for regular mammograms and breast cancer screening.  Cervical cancer screening: We reviewed recommendations for pap smear screening.   Skin cancer screening: Check your skin regularly for new changes, growing lesions, or other lesions of concern Come in for evaluation if you have skin lesions of concern.  Lung cancer screening: If you have a greater than 20 pack year history of tobacco use, then you may qualify for lung cancer screening with a chest CT scan.   Please call your insurance company to inquire about coverage for this test.  We currently don't have screenings for other cancers besides breast, cervical, colon, and lung cancers.  If you have a strong family history of cancer or have other cancer screening concerns, please let me know.    Bone health: Get at least 150 minutes of aerobic exercise weekly Get weight bearing exercise at least once weekly Bone density test:  A bone density test is an imaging test that uses a type of X-ray to measure the amount of calcium and other  minerals in your bones. The test may be used to diagnose or screen you for a condition that causes weak or thin bones (osteoporosis), predict your risk for a broken bone (fracture), or determine how well your osteoporosis treatment is working. The bone density test is recommended for females 9 and older, or females or males [id number] if certain risk factors such as thyroid disease, long term use of steroids such as for asthma or rheumatological issues, vitamin D deficiency, estrogen deficiency, family history of osteoporosis, self or family history of fragility fracture in first degree relative.    Heart health: Get at least 150 minutes of aerobic exercise  weekly Limit alcohol It is important to maintain a healthy blood pressure and healthy cholesterol numbers  Heart disease screening: Screening for heart disease includes screening for blood pressure, fasting lipids, glucose/diabetes screening, BMI height to weight ratio, reviewed of smoking status, physical activity, and diet.    Goals include blood pressure 120/80 or less, maintaining a healthy lipid/cholesterol profile, preventing diabetes or keeping diabetes numbers under good control, not smoking or using tobacco products, exercising most days per week or at least 150 minutes per week of exercise, and eating healthy variety of fruits and vegetables, healthy oils, and avoiding unhealthy food choices like fried food, fast food, high sugar and high cholesterol foods.    Other tests may possibly include EKG test, CT coronary calcium score, echocardiogram, exercise treadmill stress test.     Medical care options: I recommend you continue to seek care here first for routine care.  We try really hard to have available appointments Monday through Friday daytime hours for sick visits, acute visits, and physicals.  Urgent care should be used for after hours and weekends for significant issues that cannot wait till the next day.  The emergency department should be used for significant potentially life-threatening emergencies.  The emergency department is expensive, can often have long wait times for less significant concerns, so try to utilize primary care, urgent care, or telemedicine when possible to avoid unnecessary trips to the emergency department.  Virtual visits and telemedicine have been introduced since the pandemic started in 2020, and can be convenient ways to receive medical care.  We offer virtual appointments as well to assist you in a variety of options to seek medical care.   Separate significant issues discussed: Incontinence - pending labs, consider pelvic floor PT  Constipation -  begin miralax, consider daily fiber supplement   Madie was seen today for fasting cpe.  Diagnoses and all orders for this visit:  Encounter for health maintenance examination in adult -     TSH -     CBC with Differential/Platelet -     Lipid panel -     Hepatitis C antibody -     Hemoglobin A1c -     Comprehensive metabolic panel -     RPR+HIV+GC+CT Panel -     Hepatitis B surface antigen -     POCT Urinalysis DIP (Proadvantage Device) -     Cytology - PAP([place])  Screening for diabetes mellitus -     Hemoglobin A1c  Screening for lipid disorders -     Lipid panel  Screen for STD (sexually transmitted disease) -     Hepatitis C antibody -     RPR+HIV+GC+CT Panel -     Hepatitis B surface antigen  Urinary incontinence, unspecified type -     POCT Urinalysis DIP (Proadvantage Device)  Screening for cervical cancer -  Cytology - PAP([place])  Screening for thyroid disorder -     TSH  Chronic constipation    Follow-up pending labs, yearly for physical

## 2022-11-22 ENCOUNTER — Other Ambulatory Visit: Payer: Self-pay | Admitting: Internal Medicine

## 2022-11-22 DIAGNOSIS — R32 Unspecified urinary incontinence: Secondary | ICD-10-CM

## 2022-11-22 NOTE — Progress Notes (Signed)
Please put [facility] or [facility] for pelvic floor therapy

## 2022-11-22 NOTE — Progress Notes (Signed)
Your white counts are elevated, diabetes marker 6.0% at risk for diabetes, blood sugar was also slightly elevated.  Liver kidney and electrolytes okay, thyroid okay, cholesterol looks okay  HIV and syphilis negative, hepatitis C negative, hepatitis B negative, still pending gonorrhea chlamydia and Pap  Recommendations: I recommend careful eating habits and may be considering eating plan such as Whole 30 or other low-carb diet  If agreeable we can refer you to pelvic floor physical therapy to help with the incontinence issue which would also help with the urinary frequency  Regarding her white counts been elevated, this could be due to infection, inflammation, or other.  Is hard to say what is causing this.  Looks like your white counts have been elevated a few years according to the chart record.  This could suggest chronic inflammation.  I doubt infection as you have no current infection symptoms, no fever, no worrisome symptoms such as unexplained weight loss or night sweats  I would try to reduce things in the diet or consumption that could produce inflammation-this could include sugar intake, smoking, alcohol, or poor sleep.  Do eat a good mix of vegetables and fruits regularly including berries, a variety of different vegetables and variety of different fruits  Will respond when the other labs are resulted

## 2022-11-24 LAB — HEPATITIS C ANTIBODY: Hep C Virus Ab: NONREACTIVE

## 2022-11-24 LAB — COMPREHENSIVE METABOLIC PANEL
ALT: 11 IU/L (ref 0–32)
AST: 12 IU/L (ref 0–40)
Albumin/Globulin Ratio: 1.4 (ref 1.2–2.2)
Albumin: 4.4 g/dL (ref 4.0–5.0)
Alkaline Phosphatase: 97 IU/L (ref 44–121)
BUN/Creatinine Ratio: 11 (ref 9–23)
BUN: 9 mg/dL (ref 6–20)
Bilirubin Total: 0.3 mg/dL (ref 0.0–1.2)
CO2: 22 mmol/L (ref 20–29)
Calcium: 9.6 mg/dL (ref 8.7–10.2)
Chloride: 102 mmol/L (ref 96–106)
Creatinine, Ser: 0.85 mg/dL (ref 0.57–1.00)
Globulin, Total: 3.2 g/dL (ref 1.5–4.5)
Glucose: 106 mg/dL — ABNORMAL HIGH (ref 70–99)
Potassium: 4.4 mmol/L (ref 3.5–5.2)
Sodium: 139 mmol/L (ref 134–144)
Total Protein: 7.6 g/dL (ref 6.0–8.5)
eGFR: 96 mL/min/{1.73_m2} (ref >59)

## 2022-11-24 LAB — RPR+HIV+GC+CT PANEL
Chlamydia trachomatis, NAA: NEGATIVE
HIV Screen 4th Generation wRfx: NONREACTIVE
Neisseria Gonorrhoeae by PCR: NEGATIVE
RPR Ser Ql: NONREACTIVE

## 2022-11-24 LAB — CBC WITH DIFFERENTIAL/PLATELET
Basophils Absolute: 0 10*3/uL (ref 0.0–0.2)
Basos: 0 %
EOS (ABSOLUTE): 0.1 10*3/uL (ref 0.0–0.4)
Eos: 0 %
Hematocrit: 37.7 % (ref 34.0–46.6)
Hemoglobin: 11.6 g/dL (ref 11.1–15.9)
Immature Grans (Abs): 0 10*3/uL (ref 0.0–0.1)
Immature Granulocytes: 0 %
Lymphocytes Absolute: 3.2 10*3/uL — ABNORMAL HIGH (ref 0.7–3.1)
Lymphs: 22 %
MCH: 24.6 pg — ABNORMAL LOW (ref 26.6–33.0)
MCHC: 30.8 g/dL — ABNORMAL LOW (ref 31.5–35.7)
MCV: 80 fL (ref 79–97)
Monocytes Absolute: 0.7 10*3/uL (ref 0.1–0.9)
Monocytes: 5 %
Neutrophils Absolute: 10.3 10*3/uL — ABNORMAL HIGH (ref 1.4–7.0)
Neutrophils: 73 %
Platelets: 393 10*3/uL (ref 150–450)
RBC: 4.72 x10E6/uL (ref 3.77–5.28)
RDW: 15.2 % (ref 11.7–15.4)
WBC: 14.3 10*3/uL — ABNORMAL HIGH (ref 3.4–10.8)

## 2022-11-24 LAB — TSH: TSH: 2.31 u[IU]/mL (ref 0.450–4.500)

## 2022-11-24 LAB — LIPID PANEL
Chol/HDL Ratio: 2.5 ratio (ref 0.0–4.4)
Cholesterol, Total: 172 mg/dL (ref 100–199)
HDL: 70 mg/dL (ref >39)
LDL Chol Calc (NIH): 92 mg/dL (ref 0–99)
Triglycerides: 48 mg/dL (ref 0–149)
VLDL Cholesterol Cal: 10 mg/dL (ref 5–40)

## 2022-11-24 LAB — HEMOGLOBIN A1C
Est. average glucose Bld gHb Est-mCnc: 126 mg/dL
Hgb A1c MFr Bld: 6 % — ABNORMAL HIGH (ref 4.8–5.6)

## 2022-11-24 LAB — HEPATITIS B SURFACE ANTIGEN: Hepatitis B Surface Ag: NEGATIVE

## 2022-11-24 LAB — CYTOLOGY - PAP: Diagnosis: NEGATIVE

## 2022-11-25 ENCOUNTER — Encounter: Payer: Self-pay | Admitting: Internal Medicine

## 2023-02-21 ENCOUNTER — Ambulatory Visit: Payer: 59 | Admitting: Medical

## 2023-02-25 ENCOUNTER — Encounter: Payer: 59 | Admitting: Obstetrics and Gynecology

## 2023-03-01 ENCOUNTER — Encounter: Payer: Self-pay | Admitting: Medical

## 2023-03-15 ENCOUNTER — Encounter: Payer: Self-pay | Admitting: Family Medicine

## 2023-03-15 ENCOUNTER — Other Ambulatory Visit: Payer: Self-pay

## 2023-03-15 ENCOUNTER — Ambulatory Visit (INDEPENDENT_AMBULATORY_CARE_PROVIDER_SITE_OTHER): Payer: 59 | Admitting: Family Medicine

## 2023-03-15 VITALS — BP 122/72 | HR 98 | Ht 69.5 in | Wt 281.7 lb

## 2023-03-15 DIAGNOSIS — Z5941 Food insecurity: Secondary | ICD-10-CM | POA: Diagnosis not present

## 2023-03-15 DIAGNOSIS — N946 Dysmenorrhea, unspecified: Secondary | ICD-10-CM | POA: Insufficient documentation

## 2023-03-15 DIAGNOSIS — F32A Depression, unspecified: Secondary | ICD-10-CM

## 2023-03-15 DIAGNOSIS — Z1331 Encounter for screening for depression: Secondary | ICD-10-CM

## 2023-03-15 DIAGNOSIS — F419 Anxiety disorder, unspecified: Secondary | ICD-10-CM | POA: Diagnosis not present

## 2023-03-15 NOTE — Progress Notes (Signed)
GYNECOLOGY OFFICE VISIT NOTE  History:   Valerie Meyers is a 27 y.o. G0P0000 here today for painful periods.  On review of chart established with PCP earlier this year At that time multiple issues, reported BRBPR and heavy painful periods Notes also mention hx of dermoid ovarian cyst with removal Had an unremarkable pelvic US on 08/04/2022  Reports she had normal periods in the past but has gotten progressively worse over the past five years Usually last 5-6 days, gets large clots which has also worsened over the past few years Also has severe cramps, back pain, nausea Feels incapacitated and has trouble walking Has tried midol, heating pads, ibuprofen, warm baths Would like to get on birth control Has used depo in the past but doesn't want to go back to that method Did not have a period with depo Had trouble remembering to take pills when she had them in the past    Health Maintenance Due  Topic Date Due   COVID-19 Vaccine (1) Never done   DTaP/Tdap/Td (2 - Td or Tdap) 09/11/2015    Past Medical History:  Diagnosis Date   Allergic rhinitis    Migraine     Past Surgical History:  Procedure Laterality Date   OVARIAN CYST REMOVAL Left 2020    The following portions of the patient's history were reviewed and updated as appropriate: allergies, current medications, past family history, past medical history, past social history, past surgical history and problem list.   Health Maintenance:   Last pap: Lab Results  Component Value Date   DIAGPAP  11/19/2022    - Negative for intraepithelial lesion or malignancy (NILM)    Last mammogram:  N/a    Review of Systems:  Pertinent items noted in HPI and remainder of comprehensive ROS otherwise negative.  Physical Exam:  BP 122/72   Pulse 98   Ht 5' 9.5" (1.765 m)   Wt 281 lb 11.2 oz (127.8 kg)   LMP 02/18/2023 (Exact Date)   BMI 41.00 kg/m  CONSTITUTIONAL: Well-developed, well-nourished female in no acute distress.   HEENT:  Normocephalic, atraumatic. External right and left ear normal. No scleral icterus.  NECK: Normal range of motion, supple, no masses noted on observation SKIN: No rash noted. Not diaphoretic. No erythema. No pallor. MUSCULOSKELETAL: Normal range of motion. No edema noted. NEUROLOGIC: Alert and oriented to person, place, and time. Normal muscle tone coordination.  PSYCHIATRIC: Normal mood and affect. Normal behavior. Normal judgment and thought content. RESPIRATORY: Effort normal, no problems with respiration noted  Labs and Imaging No results found for this or any previous visit (from the past 168 hour(s)). No results found.    Assessment and Plan:   Problem List Items Addressed This Visit       Genitourinary   Dysmenorrhea    Significant pain with periods, possibly some element of endometriosis. Unfortunately we do not have the op note to review from her dermoid removal.  Discussed options, normally would lean towards OCP's but she is not interested in taking something daily. Recommended against hormonal IUD given lack of significant systemic absorption which left [place] with nexplanon. Agreed to place today but after much back and forth on the phone with her insurance company it appears they would not cover it until she took care of some premiums, she will return in 3 days for nexplanon placement.         Other   Anxiety and depression    Not discussed directly with patient, elevated  PHQ9 and GAD7 scores on intake and accepted referral from RN for IBH.       Other Visit Diagnoses     Positive screening for depression on 9-item Patient Health Questionnaire (PHQ-9)    -  Primary   Relevant Orders   Ambulatory referral to Integrated Behavioral Health   Mild food insecurity on [place] States [employer] Security Survey Module       Relevant Orders   AMBULATORY REFERRAL TO [facility]       Routine preventative health maintenance measures emphasized. Please refer  to After Visit Summary for other counseling recommendations.   Return in about 3 days (around 03/18/2023) for nexplanon insertion.    Total face-to-face time with patient: 20 minutes.  Over 50% of encounter was spent on counseling and coordination of care.   Venora Maples, MD/MPH Attending Family Medicine Physician, [facility] for [facility], [facility]

## 2023-03-15 NOTE — Assessment & Plan Note (Signed)
Significant pain with periods, possibly some element of endometriosis. Unfortunately we do not have the op note to review from her dermoid removal.  Discussed options, normally would lean towards OCP's but she is not interested in taking something daily. Recommended against hormonal IUD given lack of significant systemic absorption which left [place] with nexplanon. Agreed to place today but after much back and forth on the phone with her insurance company it appears they would not cover it until she took care of some premiums, she will return in 3 days for nexplanon placement.

## 2023-03-15 NOTE — Assessment & Plan Note (Signed)
Not discussed directly with patient, elevated PHQ9 and GAD7 scores on intake and accepted referral from RN for IBH.

## 2023-03-15 NOTE — Progress Notes (Deleted)
Reports she had normal periods in the past but has gotten progressively worse over the past five years Usually last 5-6 days, gets large clots which has also worsened over the past few years Also has severe cramps, back pain, nausea Feels incapacitated and has trouble walking Has tried midol, heating pads, ibuprofen, warm baths Would like to get on birth control Has used depo in the past but doesn't want to go back to that method Did not have a period with depo Had trouble remembering to take pills when she had them in the past

## 2023-03-17 NOTE — BH Specialist Note (Signed)
Integrated Behavioral Health via Telemedicine Visit  03/31/2023 Serrita Deacy [id number]  Number of Integrated Behavioral Health Clinician visits: 1- Initial Visit  Session Start time: 0829   Session End time: 0844  Total time in minutes: 15 Pt agrees to 15 minutes only due to insurance lapse/uninsured status  Referring Provider: Merian Capron, MD Patient/Family location: Home [facility] Provider location: Center for [facility] Healthcare at [facility] for [facility]  All persons participating in visit: Patient Lehlani Monter and [facility] Avel Ogawa   Types of Service: Individual psychotherapy and Video visit  I connected with [name] and/or Humphrey Rolls Zazueta's  n/a  via  Telephone or Engineer, civil (consulting)  (Video is Surveyor, mining) and verified that I am speaking with the correct person using two identifiers. Discussed confidentiality: Yes   I discussed the limitations of telemedicine and the availability of in person appointments.  Discussed there is a possibility of technology failure and discussed alternative modes of communication if that failure occurs.  I discussed that engaging in this telemedicine visit, they consent to the provision of behavioral healthcare and the services will be billed under their insurance.  Patient and/or legal guardian expressed understanding and consented to Telemedicine visit: Yes   Presenting Concerns: Patient and/or family reports the following symptoms/concerns: Increased depression and anxiety, attributes to current life stress (financial stress working two jobs@70  hours/week; only working adult in home with wife, their 7yo,  sister-in-law and her 57yo,5yo,4yo,2yo; 1yo). Pt copes with wine and marijuana prior to sleep with no problematic usage.  Duration of problem: Increasing over time; Severity of problem:  moderately severe  Patient and/or Family's Strengths/Protective Factors: Concrete supports in place (healthy food, safe  environments, etc.) and Sense of purpose  Goals Addressed: Patient will:  Reduce symptoms of: anxiety, depression, and stress   Increase knowledge and/or ability of: stress reduction   Demonstrate ability to: Increase healthy adjustment to current life circumstances  Progress towards Goals: Ongoing  Interventions: Interventions utilized:  Psychoeducation and/or Health Education, Link to [employer], and Supportive Reflection Standardized Assessments completed: Not Needed  Patient and/or Family Response: Patient agrees with treatment plan.    Assessment: Patient currently experiencing Major depressive disorder, recurrent, severe, without psychotic features; Psychosocial stress.   Patient may benefit from psychoeducation and brief therapeutic interventions regarding coping with symptoms of anxiety, depression; life stress .  Plan: Follow up with behavioral health clinician on : Three weeks Behavioral recommendations:  -Continue prioritizing sleep for healthy self-care -Consider using/sharing resources on After Visit Summary with household; use as needed Referral(s): Integrated Art gallery manager (In Clinic), Community Mental Health Services (LME/Outside Clinic), and Community Resources:  [employer]; Childcare; [facility]  I discussed the assessment and treatment plan with the patient and/or parent/guardian. They were provided an opportunity to ask questions and all were answered. They agreed with the plan and demonstrated an understanding of the instructions.   They were advised to call back or seek an in-person evaluation if the symptoms worsen or if the condition fails to improve as anticipated.  Valetta Close Houa Nie, LCSW     03/15/2023    2:33 PM 11/02/2022   12:09 PM  Depression screen PHQ 2/9  Decreased Interest 2 0  Down, Depressed, Hopeless 2 0  PHQ - 2 Score 4 0  Altered sleeping 3   Tired, decreased energy 3   Change in appetite 3    Feeling bad or failure about yourself  1   Trouble concentrating 2  Moving slowly or fidgety/restless 0   Suicidal thoughts 1   PHQ-9 Score 17   Difficult doing work/chores Not difficult at all       03/15/2023    2:33 PM  GAD 7 : Generalized Anxiety Score  Nervous, Anxious, on Edge 0  Control/stop worrying 2  Worry too much - different things 3  Trouble relaxing 3  Restless 0  Easily annoyed or irritable 3  Afraid - awful might happen 1  Total GAD 7 Score 12  Anxiety Difficulty Not difficult at all

## 2023-03-18 ENCOUNTER — Encounter: Payer: PRIVATE HEALTH INSURANCE | Admitting: Family Medicine

## 2023-03-31 ENCOUNTER — Ambulatory Visit: Payer: Self-pay | Admitting: Clinical

## 2023-03-31 DIAGNOSIS — F332 Major depressive disorder, recurrent severe without psychotic features: Secondary | ICD-10-CM

## 2023-03-31 NOTE — Patient Instructions (Addendum)
Center for [facility] Healthcare at [facility] [address] [place], [place] [address] [phone] (main office) [phone] ([place] Les[place]'s office)  [facility] [email or web address]  [place] [employer]  (Childcare options, Early childcare development, etc.) [email or web address]  [employer] Child Care Facility Search Engine  [email or web address]   [facility]  [address] [place], [place] [address] [phone] or [phone] [facility] 24/7 FOR ANYONE [address], [place], [place]  [phone] Fax: [phone] [email or web address] *Interpreters available *Accepts all insurance and uninsured for Urgent Care needs *Accepts Medicaid and uninsured for outpatient treatment (below)    ONLY FOR [facility]  Below:   Outpatient New Patient Assessment/Therapy Walk-ins:        Monday -Thursday 8am until slots are full.        Every Friday 1pm-4pm  (first come, first served)                   New Patient Psychiatry/Medication Management        Monday-Friday 8am-11am (first come, first served)              For all walk-ins we ask that you arrive by 7:15am, because patients will be seen in the order of arrival.

## 2023-04-11 NOTE — BH Specialist Note (Signed)
Integrated Behavioral Health via Telemedicine Visit  04/21/2023 Merryl Goth [id number]  Number of Integrated Behavioral Health Clinician visits: 2- Second Visit  Session Start time: [phone]   Session End time: 0913  Total time in minutes: 15   Referring Provider: Merian Capron, MD Patient/Family location: Home [facility] Provider location: Center for [facility] at [facility]  All persons participating in visit: Patient Valerie Meyers and [facility] Aniah Pauli   Types of Service: Individual psychotherapy and Video visit  I connected with [name] and/or Humphrey Rolls Rodda's  n/a  via  Telephone or Engineer, civil (consulting)  (Video is Surveyor, mining) and verified that I am speaking with the correct person using two identifiers. Discussed confidentiality: Yes   I discussed the limitations of telemedicine and the availability of in person appointments.  Discussed there is a possibility of technology failure and discussed alternative modes of communication if that failure occurs.  I discussed that engaging in this telemedicine visit, they consent to the provision of behavioral healthcare and the services will be billed under their insurance.  Patient and/or legal guardian expressed understanding and consented to Telemedicine visit: Yes   Presenting Concerns: Patient and/or family reports the following symptoms/concerns: Continued dissatisfaction with current workload and finding work/life balance; considering applying for specific position open as a career with benefits; fear of unknown with change; alcohol consumption decreasing over past two weeks; has added solitary game with smoking at night to help with sleep.  Duration of problem: Ongoing; Severity of problem:  moderately severe  Patient and/or Family's Strengths/Protective Factors: Concrete supports in place (healthy food, safe environments, etc.) and Sense of purpose  Goals Addressed: Patient  will:  Reduce symptoms of: anxiety, depression, and stress   Increase knowledge and/or ability of: stress reduction   Demonstrate ability to: Increase healthy adjustment to current life circumstances and Decrease self-medicating behaviors  Progress towards Goals: Ongoing  Interventions: Interventions utilized:  Motivational Interviewing and Link to [employer] Assessments completed: Not Needed  Patient and/or Family Response: Patient agrees with treatment plan.   Assessment: Patient currently experiencing Major depressive disorder, recurrent, severe, without psychotic features; Psychosocial stress.   Patient may benefit from continued therapeutic intervention  .  Plan: Follow up with behavioral health clinician on : Two weeks Behavioral recommendations:  Contractor regarding updating resume, as needed; continue plan to apply for open position -Continue prioritizing sleep for self-care; continue reduced alcohol consumption, notice any sleep changes Referral(s): Integrated Art gallery manager (In Clinic) and [employer]:  [employer]  I discussed the assessment and treatment plan with the patient and/or parent/guardian. They were provided an opportunity to ask questions and all were answered. They agreed with the plan and demonstrated an understanding of the instructions.   They were advised to call back or seek an in-person evaluation if the symptoms worsen or if the condition fails to improve as anticipated.  Rae Lips, LCSW     03/15/2023    2:33 PM 11/02/2022   12:09 PM  Depression screen PHQ 2/9  Decreased Interest 2 0  Down, Depressed, Hopeless 2 0  PHQ - 2 Score 4 0  Altered sleeping 3   Tired, decreased energy 3   Change in appetite 3   Feeling bad or failure about yourself  1   Trouble concentrating 2   Moving slowly or fidgety/restless 0   Suicidal thoughts 1   PHQ-9 Score 17    Difficult doing work/chores  Not difficult at all       03/15/2023    2:33 PM  GAD 7 : Generalized Anxiety Score  Nervous, Anxious, on Edge 0  Control/stop worrying 2  Worry too much - different things 3  Trouble relaxing 3  Restless 0  Easily annoyed or irritable 3  Afraid - awful might happen 1  Total GAD 7 Score 12  Anxiety Difficulty Not difficult at all

## 2023-04-21 ENCOUNTER — Ambulatory Visit: Payer: Self-pay | Admitting: Clinical

## 2023-04-21 DIAGNOSIS — F332 Major depressive disorder, recurrent severe without psychotic features: Secondary | ICD-10-CM

## 2023-04-21 NOTE — Patient Instructions (Signed)
[facility] [place] [address] [place], [place] [address] [phone] (main office) [phone] (Bari Handshoe's office)  [facility] [email or web address] 

## 2023-04-25 NOTE — BH Specialist Note (Unsigned)
Pt did not arrive to video visit and did not answer the phone; Left HIPPA-compliant message to call back Asher Muir from [employer] for [employer] at [facility] at  [phone] [facility] office).  ; left MyChart message for patient.

## 2023-05-05 ENCOUNTER — Ambulatory Visit: Payer: Self-pay | Admitting: Clinical

## 2023-05-05 DIAGNOSIS — Z91199 Patient's noncompliance with other medical treatment and regimen due to unspecified reason: Secondary | ICD-10-CM

## 2023-06-07 ENCOUNTER — Encounter: Payer: Self-pay | Admitting: Clinical

## 2023-06-07 NOTE — BH Specialist Note (Deleted)
Integrated Behavioral Health via Telemedicine Visit  06/07/2023 Valerie Meyers [id number]  Brief phone visit at pt request, due to "mental breakdown" attributed to current life stress (financial; childcare; marital conflict).  Valetta Close Millan Legan, LCSW

## 2023-06-14 NOTE — BH Specialist Note (Signed)
Integrated Behavioral Health via Telemedicine Visit  06/27/2023 Cyrilla Vanwye [id number]  Number of Integrated Behavioral Health Clinician visits: 3- Third Visit  Session Start time: 1548   Session End time: 1628  Total time in minutes: 40   Referring Provider: Merian Capron, MD Patient/Family location: Home [facility] Provider location: Center for [facility] at [facility] for Women  All persons participating in visit: Patient Valerie Meyers and [facility] Mckynlee Luse   Types of Service: Individual psychotherapy and Video visit  I connected with [name] and/or Humphrey Rolls Brendle's  n/a  via  Telephone or Engineer, civil (consulting)  (Video is Surveyor, mining) and verified that I am speaking with the correct person using two identifiers. Discussed confidentiality: Yes   I discussed the limitations of telemedicine and the availability of in person appointments.  Discussed there is a possibility of technology failure and discussed alternative modes of communication if that failure occurs.  I discussed that engaging in this telemedicine visit, they consent to the provision of behavioral healthcare and the services will be billed under their insurance.  Patient and/or legal guardian expressed understanding and consented to Telemedicine visit: Yes   Presenting Concerns: Patient and/or family reports the following symptoms/concerns: Increased irritability and frustration, attributes to job stress (working 70 hours/week between two jobs); feeling as if "the weight of the world is on my shoulders"; conflict with spouse, while "crushing on someone at work"; possible costly appliance repairs. Pt is looking forward to upcoming 4 year anniversary with wife; celebrating son's birthday; goal of moving to [place] within one year.  Duration of problem: Ongoing; Severity of problem:  moderately severe  Patient and/or Family's Strengths/Protective Factors: Social connections,  Concrete supports in place (healthy food, safe environments, etc.), and Sense of purpose  Goals Addressed: Patient will:  Reduce symptoms of: anxiety, depression, mood instability, and stress   Increase knowledge and/or ability of: stress reduction   Demonstrate ability to: Increase healthy adjustment to current life circumstances and Decrease self-medicating behaviors  Progress towards Goals: Ongoing  Interventions: Interventions utilized:  Motivational Interviewing and Supportive Reflection Standardized Assessments completed: Not Needed  Patient and/or Family Response: Patient agrees with treatment plan.   Assessment: Patient currently experiencing Major depressive disorder, recurrent, severe, without psychotic features; Psychosocial stress.   Patient may benefit from continued therapeutic intervention  .  Plan: Follow up with behavioral health clinician on : Two weeks Behavioral recommendations:  -Continue being mindful of alcohol use; maintaining at least 6 hours sleep daily -Continue maintaining healthy boundaries in all relationships; continue plan to celebrate upcoming wedding anniversary -Continue plan to obtain couple's counselor, as discussed Referral(s): Integrated [employer] (In Clinic)  I discussed the assessment and treatment plan with the patient and/or parent/guardian. They were provided an opportunity to ask questions and all were answered. They agreed with the plan and demonstrated an understanding of the instructions.   They were advised to call back or seek an in-person evaluation if the symptoms worsen or if the condition fails to improve as anticipated.  [name], LCSW     03/15/2023    2:33 PM 11/02/2022   12:09 PM  Depression screen PHQ 2/9  Decreased Interest 2 0  Down, Depressed, Hopeless 2 0  PHQ - 2 Score 4 0  Altered sleeping 3   Tired, decreased energy 3   Change in appetite 3   Feeling bad or failure about yourself   1   Trouble concentrating 2   Moving slowly  or fidgety/restless 0   Suicidal thoughts 1   PHQ-9 Score 17   Difficult doing work/chores Not difficult at all       03/15/2023    2:33 PM  GAD 7 : Generalized Anxiety Score  Nervous, Anxious, on Edge 0  Control/stop worrying 2  Worry too much - different things 3  Trouble relaxing 3  Restless 0  Easily annoyed or irritable 3  Afraid - awful might happen 1  Total GAD 7 Score 12  Anxiety Difficulty Not difficult at all

## 2023-06-27 ENCOUNTER — Ambulatory Visit (INDEPENDENT_AMBULATORY_CARE_PROVIDER_SITE_OTHER): Payer: Self-pay | Admitting: Clinical

## 2023-06-27 DIAGNOSIS — F332 Major depressive disorder, recurrent severe without psychotic features: Secondary | ICD-10-CM

## 2023-06-30 NOTE — BH Specialist Note (Signed)
Integrated Behavioral Health via Telemedicine Visit  07/14/2023 Valeta Paz [id number]  Number of Integrated Behavioral Health Clinician visits: 4- Fourth Visit  Session Start time: 1544   Session End time: 1632  Total time in minutes: 48   Referring Provider: Merian Capron, MD Patient/Family location: Home [employer] Provider location: Center for [facility] at [facility] for Women  All persons participating in visit: Patient Milca Sytsma and [facility] Asharia Lotter   Types of Service: Individual psychotherapy and Video visit  I connected with [name] and/or Humphrey Rolls Alverio's  n/a  via  Telephone or Engineer, civil (consulting)  (Video is Surveyor, mining) and verified that I am speaking with the correct person using two identifiers. Discussed confidentiality: Yes   I discussed the limitations of telemedicine and the availability of in person appointments.  Discussed there is a possibility of technology failure and discussed alternative modes of communication if that failure occurs.  I discussed that engaging in this telemedicine visit, they consent to the provision of behavioral healthcare and the services will be billed under their insurance.  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 Duration of problem: Ongoing; Severity of problem:  moderately severe  Patient and/or Family's Strengths/Protective Factors: Social connections, Concrete supports in place (healthy food, safe environments, etc.), and Sense of purpose  Goals Addressed: Patient will:  Reduce symptoms of:  anxiety, depression, mood instability, and stress   Increase knowledge and/or ability of: stress reduction   Demonstrate ability to: Increase healthy adjustment to current life circumstances and Increase motivation to adhere to plan of care  Progress towards Goals: Ongoing  Interventions: Interventions utilized:  Motivational Interviewing and Solution-Focused Strategies Standardized Assessments completed: Not Needed  Patient and/or Family Response: Patient agrees with treatment plan.   Assessment: Patient currently experiencing Major depressive disorder, recurrent, severe, without psychotic features; Psychosocial stress.   Patient may benefit from continued therapeutic intervention  .  Plan: Follow up with behavioral health clinician on : Two weeks Behavioral recommendations:  -Continue prioritizing healthy self-care daily (mindful of alcohol use; adequate sleep nightly; maintaining healthy boundaries in relationships with others) -Continue considering plan to obtain couple counselor of choice -Bring updated insurance information to [facility] on 07/15[facility]; use [employer] while there -Within next two weeks, do the following: 1 Get hair done; 2 Look up [email or web address] and begin formulating business plan ideas; 3 Enjoy fair with friend(s)and son  Referral(s): Integrated Art gallery manager (In Clinic) and Community Resources:  SCORE  I discussed the assessment and treatment plan with the patient and/or parent/guardian. They were provided an opportunity to ask questions and all were answered. They agreed with the plan and demonstrated an understanding of the instructions.   They were advised to call back or seek an in-person evaluation if the symptoms worsen or if the condition fails to improve as anticipated.  Rae Lips, LCSW     03/15/2023    2:33 PM 11/02/2022   12:09 PM  Depression screen PHQ 2/9  Decreased Interest 2 0  Down, Depressed, Hopeless 2 0  PHQ  - 2 Score 4 0  Altered sleeping 3   Tired, decreased energy 3  Change in appetite 3   Feeling bad or failure about yourself  1   Trouble concentrating 2   Moving slowly or fidgety/restless 0   Suicidal thoughts 1   PHQ-9 Score 17   Difficult doing work/chores Not difficult at all       03/15/2023    2:33 PM  GAD 7 : Generalized Anxiety Score  Nervous, Anxious, on Edge 0  Control/stop worrying 2  Worry too much - different things 3  Trouble relaxing 3  Restless 0  Easily annoyed or irritable 3  Afraid - awful might happen 1  Total GAD 7 Score 12  Anxiety Difficulty Not difficult at all

## 2023-07-14 ENCOUNTER — Ambulatory Visit (INDEPENDENT_AMBULATORY_CARE_PROVIDER_SITE_OTHER): Payer: Self-pay | Admitting: Clinical

## 2023-07-14 DIAGNOSIS — F332 Major depressive disorder, recurrent severe without psychotic features: Secondary | ICD-10-CM

## 2023-07-14 NOTE — Patient Instructions (Addendum)
[facility] [place] [facility] [address] [place], [place] [address] [phone] (main office) [phone] (Thuy Atilano's office)   SCORE: For the life of your business [email or web address]   [facility] [email or web address]   

## 2023-07-20 NOTE — BH Specialist Note (Unsigned)
Integrated Behavioral Health via Telemedicine Visit  07/20/2023 Valerie Meyers [id number]  Number of Integrated Behavioral Health Clinician visits: 4- Fourth Visit  Session Start time: 1544   Session End time: 1632  Total time in minutes: 48   Referring Provider: Merian Capron, MD Patient/Family location: Home*** [facility] Provider location: Center for [facility] at [facility]  All persons participating in visit: Patient Valerie Meyers and [facility] Lenox Bink ***  Types of Service: {CHL AMB TYPE OF SERVICE:[phone]}  I connected with Valerie Meyers and/or [name]'s {family members:20773} via  Telephone or Engineer, civil (consulting)  (Video is Caregility application) and verified that I am speaking with the correct person using two identifiers. Discussed confidentiality: Yes   I discussed the limitations of telemedicine and the availability of in person appointments.  Discussed there is a possibility of technology failure and discussed alternative modes of communication if that failure occurs.  I discussed that engaging in this telemedicine visit, they consent to the provision of behavioral healthcare and the services will be billed under their insurance.  Patient and/or legal guardian expressed understanding and consented to Telemedicine visit: Yes   Presenting Concerns: Patient and/or family reports the following symptoms/concerns: *** Duration of problem: ***; Severity of problem: {Mild/Moderate/Severe:20260}  Patient and/or Family's Strengths/Protective Factors: {CHL AMB BH PROTECTIVE [phone]}  Goals Addressed: Patient will:  Reduce symptoms of: {IBH Symptoms:21014056}   Increase knowledge and/or ability of: {IBH Patient Tools:21014057}   Demonstrate ability to: {IBH Goals:21014053}  Progress towards Goals: {CHL AMB BH PROGRESS TOWARDS GOALS:[phone]}  Interventions: Interventions utilized:  {IBH  Interventions:21014054} Standardized Assessments completed: {IBH Screening Tools:21014051}  Patient and/or Family Response: Patient agrees with treatment plan. ***  Assessment: Patient currently experiencing ***.   Patient may benefit from continued therapeutic intervention *** .  Plan: Follow up with behavioral health clinician on : *** Behavioral recommendations:  -*** -*** Referral(s): {IBH Referrals:21014055}  I discussed the assessment and treatment plan with the patient and/or parent/guardian. They were provided an opportunity to ask questions and all were answered. They agreed with the plan and demonstrated an understanding of the instructions.   They were advised to call back or seek an in-person evaluation if the symptoms worsen or if the condition fails to improve as anticipated.  Rae Lips, LCSW     03/15/2023    2:33 PM 11/02/2022   12:09 PM  Depression screen PHQ 2/9  Decreased Interest 2 0  Down, Depressed, Hopeless 2 0  PHQ - 2 Score 4 0  Altered sleeping 3   Tired, decreased energy 3   Change in appetite 3   Feeling bad or failure about yourself  1   Trouble concentrating 2   Moving slowly or fidgety/restless 0   Suicidal thoughts 1   PHQ-9 Score 17   Difficult doing work/chores Not difficult at all       03/15/2023    2:33 PM  GAD 7 : Generalized Anxiety Score  Nervous, Anxious, on Edge 0  Control/stop worrying 2  Worry too much - different things 3  Trouble relaxing 3  Restless 0  Easily annoyed or irritable 3  Afraid - awful might happen 1  Total GAD 7 Score 12  Anxiety Difficulty Not difficult at all

## 2023-08-03 ENCOUNTER — Ambulatory Visit (INDEPENDENT_AMBULATORY_CARE_PROVIDER_SITE_OTHER): Payer: Self-pay | Admitting: Clinical

## 2023-08-03 DIAGNOSIS — F332 Major depressive disorder, recurrent severe without psychotic features: Secondary | ICD-10-CM

## 2023-08-03 DIAGNOSIS — Z658 Other specified problems related to psychosocial circumstances: Secondary | ICD-10-CM

## 2023-08-03 NOTE — Patient Instructions (Signed)
Center for [facility] at [facility] for [facility] [address] [place], [place] [address] [phone] (main office) [phone] (Meilyn Heindl's office)  "[employer]" facebook group (has two holding hands picture, with words "We Need Each Other" under the hands)

## 2023-08-16 NOTE — BH Specialist Note (Signed)
Integrated Behavioral Health via Telemedicine Visit  08/16/2023 Valerie Meyers [id number]  Number of Integrated Behavioral Health Clinician visits: 5-Fifth Visit  Session Start time: 1547   Session End time: 1637  Total time in minutes: 50   Referring Provider: *** Patient/Family location: [facility] Provider location: *** All persons participating in visit: *** Types of Service: {CHL AMB TYPE OF [phone]}  I connected with Valerie Meyers and/or Valerie Meyers's {family members:20773} via  Telephone or Engineer, civil (consulting)  (Video is Surveyor, mining) and verified that I am speaking with the correct person using two identifiers. Discussed confidentiality: {YES/NO:21197}  I discussed the limitations of telemedicine and the availability of in person appointments.  Discussed there is a possibility of technology failure and discussed alternative modes of communication if that failure occurs.  I discussed that engaging in this telemedicine visit, they consent to the provision of behavioral healthcare and the services will be billed under their insurance.  Patient and/or legal guardian expressed understanding and consented to Telemedicine visit: {YES/NO:21197}  Presenting Concerns: Patient and/or family reports the following symptoms/concerns: *** Duration of problem: ***; Severity of problem: {Mild/Moderate/Severe:20260}  Patient and/or Family's Strengths/Protective Factors: {CHL AMB BH PROTECTIVE FACTORS:[phone]}  Goals Addressed: Patient will:  Reduce symptoms of: {IBH Symptoms:21014056}   Increase knowledge and/or ability of: {IBH Patient Tools:21014057}   Demonstrate ability to: {IBH Goals:21014053}  Progress towards Goals: {CHL AMB BH PROGRESS TOWARDS GOALS:[phone]}  Interventions: Interventions utilized:  {IBH Interventions:21014054} Standardized Assessments completed: {IBH Screening Tools:21014051}  Patient and/or Family Response:  ***  Assessment: Patient currently experiencing ***.   Patient may benefit from ***.  Plan: Follow up with behavioral health clinician on : *** Behavioral recommendations: *** Referral(s): {IBH Referrals:21014055}  I discussed the assessment and treatment plan with the patient and/or parent/guardian. They were provided an opportunity to ask questions and all were answered. They agreed with the plan and demonstrated an understanding of the instructions.   They were advised to call back or seek an in-person evaluation if the symptoms worsen or if the condition fails to improve as anticipated.  Valerie Close Sylvia Helms, LCSW

## 2023-08-19 ENCOUNTER — Encounter: Payer: PRIVATE HEALTH INSURANCE | Admitting: Family Medicine

## 2023-08-19 ENCOUNTER — Ambulatory Visit: Payer: PRIVATE HEALTH INSURANCE | Admitting: Family Medicine

## 2023-08-29 ENCOUNTER — Ambulatory Visit: Payer: Self-pay | Admitting: Clinical

## 2023-08-29 DIAGNOSIS — Z91199 Patient's noncompliance with other medical treatment and regimen due to unspecified reason: Secondary | ICD-10-CM

## 2024-05-16 ENCOUNTER — Emergency Department (HOSPITAL_COMMUNITY): Payer: Worker's Compensation

## 2024-05-16 ENCOUNTER — Emergency Department (HOSPITAL_COMMUNITY)
Admission: EM | Admit: 2024-05-16 | Discharge: 2024-05-16 | Disposition: A | Discharge status: Home / Self Care | Payer: Worker's Compensation | Attending: Emergency Medicine | Admitting: Emergency Medicine

## 2024-05-16 ENCOUNTER — Other Ambulatory Visit: Payer: Self-pay

## 2024-05-16 DIAGNOSIS — Y9302 Activity, running: Secondary | ICD-10-CM | POA: Insufficient documentation

## 2024-05-16 DIAGNOSIS — W01198A Fall on same level from slipping, tripping and stumbling with subsequent striking against other object, initial encounter: Secondary | ICD-10-CM | POA: Diagnosis not present

## 2024-05-16 DIAGNOSIS — S0012XA Contusion of left eyelid and periocular area, initial encounter: Secondary | ICD-10-CM | POA: Diagnosis not present

## 2024-05-16 DIAGNOSIS — S0083XA Contusion of other part of head, initial encounter: Secondary | ICD-10-CM | POA: Insufficient documentation

## 2024-05-16 DIAGNOSIS — S80812A Abrasion, left lower leg, initial encounter: Secondary | ICD-10-CM | POA: Diagnosis not present

## 2024-05-16 DIAGNOSIS — W19XXXA Unspecified fall, initial encounter: Secondary | ICD-10-CM

## 2024-05-16 DIAGNOSIS — S0993XA Unspecified injury of face, initial encounter: Secondary | ICD-10-CM | POA: Diagnosis present

## 2024-05-16 MED ORDER — OXYCODONE HCL 5 MG PO TABS: 5.0000 mg | ORAL_TABLET | ORAL | 0 refills | Status: AC | PRN

## 2024-05-16 MED ORDER — FLUORESCEIN SODIUM 1 MG OP STRP
1.0000 | ORAL_STRIP | Freq: Once | OPHTHALMIC | Status: AC
  Administered 2024-05-16: 1 via OPHTHALMIC
  Filled 2024-05-16: qty 1

## 2024-05-16 MED ORDER — TETRACAINE HCL 0.5 % OP SOLN
2.0000 [drp] | Freq: Once | OPHTHALMIC | Status: AC
Start: 2024-05-16 — End: 2024-05-16
  Administered 2024-05-16: 2 [drp] via OPHTHALMIC
  Filled 2024-05-16: qty 4

## 2024-05-16 MED ORDER — METHOCARBAMOL 500 MG PO TABS: 500.0000 mg | ORAL_TABLET | Freq: Two times a day (BID) | ORAL | 0 refills | Status: AC | PRN

## 2024-05-16 MED ORDER — MORPHINE SULFATE (PF) 4 MG/ML IV SOLN
4.0000 mg | Freq: Once | INTRAVENOUS | Status: AC
  Administered 2024-05-16: 4 mg via INTRAVENOUS
  Filled 2024-05-16: qty 1

## 2024-05-16 NOTE — ED Provider Notes (Signed)
 [facility]MENT AT [facility] Provider Note   CSN: [id number] Arrival date & time: 05/16/24  1556     Patient presents with: Facial Pain and Headache   Valerie Meyers is a 28 y.o. female reportedly otherwise healthy presents emerged from today for evaluation after fall.  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  No loss of consciousness.  Reports that she is having some left-sided face pain.  Denies any visual changes however patient insisted wearing glasses.  She denies any actual eyeball pain.  She denies any blood thinner use.  She has some scrapes to her left arm but denies any other pain. She was able to get up and ambulate on her own. Unknown last tetanus. Allergic to bandaids.    Headache Associated symptoms: no abdominal pain, no back pain, no dizziness, no eye pain, no fever, no myalgias, no neck pain, no numbness, no photophobia and no weakness        Prior to Admission medications   Medication Sig Start Date End Date Taking? Authorizing Provider  methocarbamol  (ROBAXIN ) 500 MG tablet Take 1 tablet (500 mg total) by mouth 2 (two) times daily as needed. 05/16/24  Yes Bernis Ernst, PA-C  oxyCODONE  (ROXICODONE ) 5 MG immediate release tablet Take 1 tablet (5 mg total) by mouth every 4 (four) hours as needed for severe pain (pain score 7-10). 05/16/24  Yes Bernis Ernst, PA-C    Allergies: Other    Review of Systems  Constitutional:  Negative for chills and fever.  Eyes:  Negative for photophobia, pain and visual disturbance.  Respiratory:  Negative for shortness of breath.   Cardiovascular:  Negative for chest pain.  Gastrointestinal:  Negative for abdominal pain.  Musculoskeletal:  Negative for arthralgias, back pain, gait problem, myalgias and neck pain.  Neurological:  Positive for headaches. Negative for dizziness, syncope,  weakness, light-headedness and numbness.    Updated Vital Signs BP 119/68   Pulse 78   Temp 98.5 F (36.9 C) (Oral)   Resp 16   SpO2 100%   Physical Exam Vitals and nursing note reviewed.  Constitutional:      General: She is not in acute distress.    Appearance: She is not ill-appearing or toxic-appearing.     Comments: On phone in no acute distress.   HENT:     Head:     Comments: No Battle signs or [place] eyes    Mouth/Throat:     Mouth: Mucous membranes are moist.  Eyes:     General: No visual field deficit.    Extraocular Movements: Extraocular movements intact.     Pupils: Pupils are equal, round, and reactive to light.     Comments: Fluorescein  examination unremarkable.  No ulcerations, abrasions seen.  Negative Seidel sign.  PERRLA.  EOMI.  Patient does have some bruising surrounding the left eye with some swelling.  Consistent with hematoma/contusion. Mild pain with movement of the eye, but hurts to the surrounding skin.   Neck:     Comments: Bilateral paraspinal cervical tenderness left greater than right. No midline tenderness to palpation. No step off or deformity.  Cardiovascular:     Rate and Rhythm: Normal rate.  Pulmonary:     Effort: Pulmonary effort is normal. No respiratory distress.  Chest:     Chest wall: Tenderness present.  Comments: Tenderness mark about the area however mild.  No step-off or deformity.  No overlying skin changes or signs of trauma. Abdominal:     Palpations: Abdomen is soft.     Tenderness: There is no abdominal tenderness.  Musculoskeletal:     Comments: No snuffbox tenderness to palpation.  Mild abrasions seen in the left [facility] as well as the left thenar.  No step-offs or palpation.  Palpable radial pulses.  Full range of motion without pain.  Compartments are soft.  Brisk refill.  No other tenderness of the extremity.  Skin:    General: Skin is warm and dry.  Neurological:     Mental Status: She is alert.     GCS: GCS eye  subscore is 4. GCS verbal subscore is 5. GCS motor subscore is 6.     Cranial Nerves: No cranial nerve deficit, dysarthria or facial asymmetry.     Sensory: No sensory deficit.     Motor: No weakness.     Gait: Gait normal.     (all labs ordered are listed, but only abnormal results are displayed) Labs Reviewed - No data to display  EKG: None  Radiology: CT Maxillofacial Wo Contrast Result Date: 05/16/2024 CLINICAL DATA:  Facial trauma, blunt; Head trauma, moderate-severe; Neck trauma, dangerous injury mechanism (Age [age]y) was chasing someone at work when she fell and hit her face. Swelling above left eye, no laceration. Reports HA and some nausea EXAM: CT HEAD WITHOUT CONTRAST CT MAXILLOFACIAL WITHOUT CONTRAST CT CERVICAL SPINE WITHOUT CONTRAST TECHNIQUE: Multidetector CT imaging of the head, cervical spine, and maxillofacial structures were performed using the standard protocol without intravenous contrast. Multiplanar CT image reconstructions of the cervical spine and maxillofacial structures were also generated. RADIATION DOSE REDUCTION: This exam was performed according to the departmental dose-optimization program which includes automated exposure control, adjustment of the mA and/or kV according to patient size and/or use of iterative reconstruction technique. COMPARISON:  None Available. FINDINGS: CT HEAD FINDINGS Brain: No evidence of large-territorial acute infarction. No parenchymal hemorrhage. No mass lesion. No extra-axial collection. No mass effect or midline shift. No hydrocephalus. Basilar cisterns are patent. Vascular: No hyperdense vessel. Skull: No acute fracture or focal lesion. Other: None. CT MAXILLOFACIAL FINDINGS Osseous: No fracture or mandibular dislocation. No destructive process. Sinuses/Orbits: Bilateral mild maxillary sinus mucosal thickening. Otherwise paranasal sinuses and mastoid air cells are clear. The orbits are unremarkable. Soft tissues: Left maxillary soft  tissue hematoma formation. Left periorbital hematoma formation. CT CERVICAL SPINE FINDINGS Alignment: Reversal of the normal cervical lordosis likely due to positioning. Skull base and vertebrae: No acute fracture. No aggressive appearing focal osseous lesion or focal pathologic process. Soft tissues and spinal canal: No prevertebral fluid or swelling. No visible canal hematoma. Upper chest: Unremarkable. Other: None. IMPRESSION: 1.  No acute intracranial abnormality. 2.  No acute displaced facial fracture. 3. No acute displaced fracture or traumatic listhesis of the cervical spine. 4. Left face hematoma. Electronically Signed   By: Morgane  Naveau M.D.   On: 05/16/2024 19:48   CT Head Wo Contrast Result Date: 05/16/2024 CLINICAL DATA:  Facial trauma, blunt; Head trauma, moderate-severe; Neck trauma, dangerous injury mechanism (Age 54-64y) was chasing someone at work when she fell and hit her face. Swelling above left eye, no laceration. Reports HA and some nausea EXAM: CT HEAD WITHOUT CONTRAST CT MAXILLOFACIAL WITHOUT CONTRAST CT CERVICAL SPINE WITHOUT CONTRAST TECHNIQUE: Multidetector CT imaging of the head, cervical spine, and maxillofacial structures were performed using the standard  protocol without intravenous contrast. Multiplanar CT image reconstructions of the cervical spine and maxillofacial structures were also generated. RADIATION DOSE REDUCTION: This exam was performed according to the departmental dose-optimization program which includes automated exposure control, adjustment of the mA and/or kV according to patient size and/or use of iterative reconstruction technique. COMPARISON:  None Available. FINDINGS: CT HEAD FINDINGS Brain: No evidence of large-territorial acute infarction. No parenchymal hemorrhage. No mass lesion. No extra-axial collection. No mass effect or midline shift. No hydrocephalus. Basilar cisterns are patent. Vascular: No hyperdense vessel. Skull: No acute fracture or focal  lesion. Other: None. CT MAXILLOFACIAL FINDINGS Osseous: No fracture or mandibular dislocation. No destructive process. Sinuses/Orbits: Bilateral mild maxillary sinus mucosal thickening. Otherwise paranasal sinuses and mastoid air cells are clear. The orbits are unremarkable. Soft tissues: Left maxillary soft tissue hematoma formation. Left periorbital hematoma formation. CT CERVICAL SPINE FINDINGS Alignment: Reversal of the normal cervical lordosis likely due to positioning. Skull base and vertebrae: No acute fracture. No aggressive appearing focal osseous lesion or focal pathologic process. Soft tissues and spinal canal: No prevertebral fluid or swelling. No visible canal hematoma. Upper chest: Unremarkable. Other: None. IMPRESSION: 1.  No acute intracranial abnormality. 2.  No acute displaced facial fracture. 3. No acute displaced fracture or traumatic listhesis of the cervical spine. 4. Left face hematoma. Electronically Signed   By: Morgane  Naveau M.D.   On: 05/16/2024 19:48   CT Cervical Spine Wo Contrast Result Date: 05/16/2024 CLINICAL DATA:  Facial trauma, blunt; Head trauma, moderate-severe; Neck trauma, dangerous injury mechanism (Age [age]y) was chasing someone at work when she fell and hit her face. Swelling above left eye, no laceration. Reports HA and some nausea EXAM: CT HEAD WITHOUT CONTRAST CT MAXILLOFACIAL WITHOUT CONTRAST CT CERVICAL SPINE WITHOUT CONTRAST TECHNIQUE: Multidetector CT imaging of the head, cervical spine, and maxillofacial structures were performed using the standard protocol without intravenous contrast. Multiplanar CT image reconstructions of the cervical spine and maxillofacial structures were also generated. RADIATION DOSE REDUCTION: This exam was performed according to the departmental dose-optimization program which includes automated exposure control, adjustment of the mA and/or kV according to patient size and/or use of iterative reconstruction technique. COMPARISON:   None Available. FINDINGS: CT HEAD FINDINGS Brain: No evidence of large-territorial acute infarction. No parenchymal hemorrhage. No mass lesion. No extra-axial collection. No mass effect or midline shift. No hydrocephalus. Basilar cisterns are patent. Vascular: No hyperdense vessel. Skull: No acute fracture or focal lesion. Other: None. CT MAXILLOFACIAL FINDINGS Osseous: No fracture or mandibular dislocation. No destructive process. Sinuses/Orbits: Bilateral mild maxillary sinus mucosal thickening. Otherwise paranasal sinuses and mastoid air cells are clear. The orbits are unremarkable. Soft tissues: Left maxillary soft tissue hematoma formation. Left periorbital hematoma formation. CT CERVICAL SPINE FINDINGS Alignment: Reversal of the normal cervical lordosis likely due to positioning. Skull base and vertebrae: No acute fracture. No aggressive appearing focal osseous lesion or focal pathologic process. Soft tissues and spinal canal: No prevertebral fluid or swelling. No visible canal hematoma. Upper chest: Unremarkable. Other: None. IMPRESSION: 1.  No acute intracranial abnormality. 2.  No acute displaced facial fracture. 3. No acute displaced fracture or traumatic listhesis of the cervical spine. 4. Left face hematoma. Electronically Signed   By: Morgane  Naveau M.D.   On: 05/16/2024 19:48   DG Chest 2 View Result Date: 05/16/2024 CLINICAL DATA:  Left rib pain after fall, short of breath EXAM: CHEST - 2 VIEW COMPARISON:  None Available. FINDINGS: Frontal and lateral views of the chest demonstrate  an unremarkable cardiac silhouette. No acute airspace disease, effusion, or pneumothorax. No acute displaced fracture. IMPRESSION: 1. No acute intrathoracic process. Electronically Signed   By: Ozell Daring M.D.   On: 05/16/2024 19:15   Procedures   Medications Ordered in the ED  fluorescein  ophthalmic strip 1 strip (1 strip Left Eye Given 05/16/24 1810)  tetracaine  (PONTOCAINE) 0.5 % ophthalmic solution 2 drop (2  drops Left Eye Given 05/16/24 1810)  morphine  (PF) 4 MG/ML injection 4 mg (4 mg Intravenous Given 05/16/24 1810)    Medical Decision Making Amount and/or Complexity of Data Reviewed Radiology: ordered.  Risk Prescription drug management.   28 y.o. female presents to the ER for evaluation of fall with left-sided facial pain. Differential diagnosis includes but is not limited to trauma, soft tissue injury, bony trauma. Vital signs unremarkable. Physical exam as noted above.   Tetanus from 2016, up-to-date.  She does not have any bony tenderness but does have some road rash present to the ACs as well as the thenar eminence but no snuffbox tenderness.  Do not see any imaging needed at this time of the extremity.  She is having some left rib tenderness but appears more musculoskeletal will obtain chest x-ray.  No chest pain or shortness of breath.  She has no abdominal tenderness palpation or overlying skin changes or signs of trauma.  Will continue with CT head, max face, cervical spine as well as chest x-ray.  CT Head, MaxFace, cervical spine shows 1.  No acute intracranial abnormality. 2.  No acute displaced facial fracture. 3. No acute displaced fracture or traumatic listhesis of the cervical spine. 4. Left face hematoma. Per radiologist's interpretation.    CXR shows 1. No acute intrathoracic process.  On fluorescein  examination, unremarkable.  Patient likely just having some pain from the surrounding injury.  She has no proptosis, doubt any developing hematoma.  Visual fields are symmetric and intact.  No dendritic lesions or ulcerations.  No signs of globe rupture.  Negative Seidel sign.  Patient is been ambulatory.  No focal neurological examination.  Stable for discharge home with strict return precautions.  Will give her pain medication and muscle relaxers.  Recommended applying ice to the area as needed.  We discussed the results of the labs/imaging. The plan is supportive care, follow-up  PCP. We discussed strict return precautions and red flag symptoms. The patient verbalized their understanding and agrees to the plan. The patient is stable and being discharged home in good condition.  Portions of this report may have been transcribed using voice recognition software. Every effort was made to ensure accuracy; however, inadvertent computerized transcription errors may be present.   Final diagnoses:  Facial injury, initial encounter  Fall, initial encounter  Contusion of face, initial encounter    ED Discharge Orders          Ordered    oxyCODONE  (ROXICODONE ) 5 MG immediate release tablet  Every 4 hours PRN        05/16/24 2127    methocarbamol  (ROBAXIN ) 500 MG tablet  2 times daily PRN        05/16/24 2127               Bernis Ernst, PA-C 05/16/24 2155    Dasie Faden, MD 05/17/24 2201

## 2024-05-16 NOTE — Discharge Instructions (Signed)
 You were seen in the emergency department today for evaluation after your fall.  Your CT scan shows that you have a hematoma however no broken bones were seen.  You likely have a bruise/hematoma to your face which is causing your pain.  I have included the information on to the discharge paperwork for you to review to help with this.  For pain, I recommend taking 1000 mg of Tylenol  and/or 600 mg of ibuprofen every 6 hours as needed for pain.  I have also sent you home some muscle relaxers you can take as needed.  Additionally, if you have any breakthrough pain, I have prescribed you a few narcotic pain medications to take.  Please do not drive or operate any heavy machinery while on the muscle relaxer or narcotic pain medication as it will make you sleepy.  Please make sure you follow-up with your primary care provider/Worker's Comp. provider for reevaluation.  If you have any concerns, new or worsening symptoms, please return to the nearest emergency department for reevaluation. Contact a doctor if: You have trouble biting or chewing. Your pain or swelling gets worse. The bruised area gets worse. Get help right away if: You have very bad pain or a headache, and medicine does not help. You are very tired or confused. Your personality changes. You vomit. You have a nosebleed that does not stop. You see two of everything (double vision) or have blurry vision. You have clear fluid coming from your nose or ear, and it does not go away. You have problems walking or using your arms or legs. You feel very dizzy.

## 2024-05-16 NOTE — ED Triage Notes (Signed)
 Pt was chasing someone at work when she fell and hit her face. Swelling above left eye, no laceration. Reports HA and some nausea

## 2024-05-16 NOTE — ED Notes (Addendum)
 Visual Acuity tested for both eyes, pt read up to the third row 20/50 15ft in right eye and third row 20/50 50ft in left eye. Pt states she does wear glasses that she forgot today
